# Patient Record
Sex: Male | Born: 1980 | Hispanic: Yes | Marital: Single | Smoking: Current every day smoker
Health system: Southern US, Community
[De-identification: ages and names within clinical notes are randomized; demographics above are authoritative.]

## PROBLEM LIST (undated history)

## (undated) DIAGNOSIS — E119 Type 2 diabetes mellitus without complications: Secondary | ICD-10-CM

---

## 2001-08-26 ENCOUNTER — Ambulatory Visit (HOSPITAL_COMMUNITY): Admission: RE | Admit: 2001-08-26 | Discharge: 2001-08-26 | Payer: Self-pay | Admitting: Internal Medicine

## 2001-08-26 ENCOUNTER — Encounter: Payer: Self-pay | Admitting: Internal Medicine

## 2001-10-02 ENCOUNTER — Emergency Department (HOSPITAL_COMMUNITY): Admission: EM | Admit: 2001-10-02 | Discharge: 2001-10-02 | Payer: Self-pay | Admitting: Emergency Medicine

## 2001-10-07 ENCOUNTER — Encounter: Payer: Self-pay | Admitting: General Surgery

## 2001-10-07 ENCOUNTER — Observation Stay (HOSPITAL_COMMUNITY): Admission: RE | Admit: 2001-10-07 | Discharge: 2001-10-08 | Payer: Self-pay | Admitting: General Surgery

## 2002-10-22 ENCOUNTER — Emergency Department (HOSPITAL_COMMUNITY): Admission: EM | Admit: 2002-10-22 | Discharge: 2002-10-23 | Payer: Self-pay | Admitting: Emergency Medicine

## 2014-06-09 ENCOUNTER — Encounter (HOSPITAL_BASED_OUTPATIENT_CLINIC_OR_DEPARTMENT_OTHER): Payer: Self-pay | Admitting: Emergency Medicine

## 2014-06-09 ENCOUNTER — Emergency Department (HOSPITAL_BASED_OUTPATIENT_CLINIC_OR_DEPARTMENT_OTHER)
Admission: EM | Admit: 2014-06-09 | Discharge: 2014-06-09 | Disposition: A | Discharge status: Home / Self Care | Payer: BC Managed Care – PPO | Attending: Emergency Medicine | Admitting: Emergency Medicine

## 2014-06-09 ENCOUNTER — Emergency Department (HOSPITAL_BASED_OUTPATIENT_CLINIC_OR_DEPARTMENT_OTHER): Payer: BC Managed Care – PPO

## 2014-06-09 DIAGNOSIS — F17200 Nicotine dependence, unspecified, uncomplicated: Secondary | ICD-10-CM | POA: Insufficient documentation

## 2014-06-09 DIAGNOSIS — Z79899 Other long term (current) drug therapy: Secondary | ICD-10-CM | POA: Insufficient documentation

## 2014-06-09 DIAGNOSIS — M47817 Spondylosis without myelopathy or radiculopathy, lumbosacral region: Secondary | ICD-10-CM | POA: Insufficient documentation

## 2014-06-09 DIAGNOSIS — M549 Dorsalgia, unspecified: Secondary | ICD-10-CM | POA: Insufficient documentation

## 2014-06-09 DIAGNOSIS — M47816 Spondylosis without myelopathy or radiculopathy, lumbar region: Secondary | ICD-10-CM

## 2014-06-09 MED ORDER — CYCLOBENZAPRINE HCL 5 MG PO TABS: 5.0000 mg | ORAL_TABLET | Freq: Three times a day (TID) | ORAL | Status: DC | PRN

## 2014-06-09 MED ORDER — METHYLPREDNISOLONE (PAK) 4 MG PO TABS: ORAL_TABLET | ORAL | Status: DC

## 2014-06-09 NOTE — Discharge Instructions (Signed)
You were treated for low back pain. 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 Also there is some increased straightening of your spine, which may be due to muscle spasm and causing some pain. Prescribed Steroid Dose Pack - follow instructions and complete this medicine to reduce inflammation. Prescribed Flexeril (muscle relaxant) - take 1 to 2 tablets up to 3 times a day as needed for pain. Caution this may make you sleepy, do not take if driving or while working with heavy machinery.  If you develop significant worsening symptoms with back pain, fever/chills, difficulty urinating or unable to control urination / bowel movements, worsening numbness, tingling or shooting pain down your legs, please call your doctor and seek immediate medical attention, please return to Emergency Department for further evaluation.  Resources provided to help you locate a regular doctor. Recommend follow-up with a local Sports Medicine Doctor Vincenza Hews [place]) - call [phone] to schedule an appointment.   Degenerative Disk Disease Degenerative disk disease is a condition caused by the changes that occur in the cushions of the backbone (spinal disks) as you grow older. Spinal disks are soft and compressible disks located between the bones of the spine (vertebrae). They act like shock absorbers. Degenerative disk disease can affect the whole spine. However, the neck and lower back are most commonly affected. Many changes can occur in the spinal disks with aging, such as:  The spinal disks may dry and shrink.  Small tears may occur in the tough, outer covering of the disk (annulus).  The disk space may become smaller due to loss of water.  Abnormal growths in the bone (spurs) may occur. This can put pressure on the nerve roots exiting the spinal canal, causing pain.  The spinal canal may become narrowed. CAUSES  Degenerative disk disease is a condition caused by the changes  that occur in the spinal disks with aging. The exact cause is not known, but there is a genetic basis for many patients. Degenerative changes can occur due to loss of fluid in the disk. This makes the disk thinner and reduces the space between the backbones. Small cracks can develop in the outer layer of the disk. This can lead to the breakdown of the disk. You are more likely to get degenerative disk disease if you are overweight. Smoking cigarettes and doing heavy work such as weightlifting can also increase your risk of this condition. Degenerative changes can start after a sudden injury. Growth of bone spurs can compress the nerve roots and cause pain.  SYMPTOMS  The symptoms vary from person to person. Some people may have no pain, while others have severe pain. The pain may be so severe that it can limit your activities. The location of the pain depends on the part of your backbone that is affected. You will have neck or arm pain if a disk in the neck area is affected. You will have pain in your back, buttocks, or legs if a disk in the lower back is affected. The pain becomes worse while bending, reaching up, or with twisting movements. The pain may start gradually and then get worse as time passes. It may also start after a major or minor injury. You may feel numbness or tingling in the arms or legs.  DIAGNOSIS  Your caregiver will ask you about your symptoms and about activities or habits that may cause the pain. He or she may also ask about any injuries, diseases, or treatments you  have had earlier. Your caregiver will examine you to check for the range of movement that is possible in the affected area, to check for strength in your extremities, and to check for sensation in the areas of the arms and legs supplied by different nerve roots. An X-ray of the spine may be taken. Your caregiver may suggest other imaging tests, such as magnetic resonance imaging (MRI), if needed.  TREATMENT  Treatment  includes rest, modifying your activities, and applying ice and heat. Your caregiver may prescribe medicines to reduce your pain and may ask you to do some exercises to strengthen your back. In some cases, you may need surgery. You and your caregiver will decide on the treatment that is best for you. HOME CARE INSTRUCTIONS   Follow proper lifting and walking techniques as advised by your caregiver.  Maintain good posture.  Exercise regularly as advised.  Perform relaxation exercises.  Change your sitting, standing, and sleeping habits as advised. Change positions frequently.  Lose weight as advised.  Stop smoking if you smoke.  Wear supportive footwear. SEEK MEDICAL CARE IF:  Your pain does not go away within 1 to 4 weeks. SEEK IMMEDIATE MEDICAL CARE IF:   Your pain is severe.  You notice weakness in your arms, hands, or legs.  You begin to lose control of your bladder or bowel movements. MAKE SURE YOU:   Understand these instructions.  Will watch your condition.  Will get help right away if you are not doing well or get worse. Document Released: 08/18/2007 Document Revised: 01/13/2012 Document Reviewed: 02/22/2014 [facility] Patient Information 2015 [place], [place]. This information is not intended to replace advice given to you by your health care provider. Make sure you discuss any questions you have with your health care provider.  Back Exercises These exercises may help you when beginning to rehabilitate your injury. Your symptoms may resolve with or without further involvement from your physician, physical therapist or athletic trainer. While completing these exercises, remember:   Restoring tissue flexibility helps normal motion to return to the joints. This allows healthier, less painful movement and activity.  An effective stretch should be held for at least 30 seconds.  A stretch should never be painful. You should only feel a gentle lengthening or release in the  stretched tissue. STRETCH - Extension, Prone on Elbows   Lie on your stomach on the floor, a bed will be too soft. Place your palms about shoulder width apart and at the height of your head.  Place your elbows under your shoulders. If this is too painful, stack pillows under your chest.  Allow your body to relax so that your hips drop lower and make contact more completely with the floor.  Hold this position for __________ seconds.  Slowly return to lying flat on the floor. Repeat __________ times. Complete this exercise __________ times per day.  RANGE OF MOTION - Extension, Prone Press Ups   Lie on your stomach on the floor, a bed will be too soft. Place your palms about shoulder width apart and at the height of your head.  Keeping your back as relaxed as possible, slowly straighten your elbows while keeping your hips on the floor. You may adjust the placement of your hands to maximize your comfort. As you gain motion, your hands will come more underneath your shoulders.  Hold this position __________ seconds.  Slowly return to lying flat on the floor. Repeat __________ times. Complete this exercise __________ times per day.  RANGE OF MOTION- Quadruped, Neutral Spine   Assume a hands and knees position on a firm surface. Keep your hands under your shoulders and your knees under your hips. You may place padding under your knees for comfort.  Drop your head and point your tail bone toward the ground below you. This will round out your low back like an angry cat. Hold this position for __________ seconds.  Slowly lift your head and release your tail bone so that your back sags into a large arch, like an old horse.  Hold this position for __________ seconds.  Repeat this until you feel limber in your low back.  Now, find your "sweet spot." This will be the most comfortable position somewhere between the two previous positions. This is your neutral spine. Once you have found this  position, tense your stomach muscles to support your low back.  Hold this position for __________ seconds. Repeat __________ times. Complete this exercise __________ times per day.  STRETCH - Flexion, Single Knee to Chest   Lie on a firm bed or floor with both legs extended in front of you.  Keeping one leg in contact with the floor, bring your opposite knee to your chest. Hold your leg in place by either grabbing behind your thigh or at your knee.  Pull until you feel a gentle stretch in your low back. Hold __________ seconds.  Slowly release your grasp and repeat the exercise with the opposite side. Repeat __________ times. Complete this exercise __________ times per day.  STRETCH - Hamstrings, Standing  Stand or sit and extend your right / left leg, placing your foot on a chair or foot stool  Keeping a slight arch in your low back and your hips straight forward.  Lead with your chest and lean forward at the waist until you feel a gentle stretch in the back of your right / left knee or thigh. (When done correctly, this exercise requires leaning only a small distance.)  Hold this position for __________ seconds. Repeat __________ times. Complete this stretch __________ times per day. STRENGTHENING - Deep Abdominals, Pelvic Tilt   Lie on a firm bed or floor. Keeping your legs in front of you, bend your knees so they are both pointed toward the ceiling and your feet are flat on the floor.  Tense your lower abdominal muscles to press your low back into the floor. This motion will rotate your pelvis so that your tail bone is scooping upwards rather than pointing at your feet or into the floor.  With a gentle tension and even breathing, hold this position for __________ seconds. Repeat __________ times. Complete this exercise __________ times per day.  STRENGTHENING - Abdominals, Crunches   Lie on a firm bed or floor. Keeping your legs in front of you, bend your knees so they are both  pointed toward the ceiling and your feet are flat on the floor. Cross your arms over your chest.  Slightly tip your chin down without bending your neck.  Tense your abdominals and slowly lift your trunk high enough to just clear your shoulder blades. Lifting higher can put excessive stress on the low back and does not further strengthen your abdominal muscles.  Control your return to the starting position. Repeat __________ times. Complete this exercise __________ times per day.  STRENGTHENING - Quadruped, Opposite UE/LE Lift   Assume a hands and knees position on a firm surface. Keep your hands under your shoulders and your knees under your  hips. You may place padding under your knees for comfort.  Find your neutral spine and gently tense your abdominal muscles so that you can maintain this position. Your shoulders and hips should form a rectangle that is parallel with the floor and is not twisted.  Keeping your trunk steady, lift your right hand no higher than your shoulder and then your left leg no higher than your hip. Make sure you are not holding your breath. Hold this position __________ seconds.  Continuing to keep your abdominal muscles tense and your back steady, slowly return to your starting position. Repeat with the opposite arm and leg. Repeat __________ times. Complete this exercise __________ times per day. Document Released: 11/08/2005 Document Revised: 01/13/2012 Document Reviewed: 02/02/2009 [facility] Patient Information 2015 [place], [place]. This information is not intended to replace advice given to you by your health care provider. Make sure you discuss any questions you have with your health care provider.   Emergency Department Resource Guide 1) Find a Doctor and Pay Out of Pocket Although you won't have to find out who is covered by your insurance plan, it is a good idea to ask around and get recommendations. You will then need to call the office and see if the doctor  you have chosen will accept you as a new patient and what types of options they offer for patients who are self-pay. Some doctors offer discounts or will set up payment plans for their patients who do not have insurance, but you will need to ask so you aren't surprised when you get to your appointment.  2) Contact Your Local Health Department Not all health departments have doctors that can see patients for sick visits, but many do, so it is worth a call to see if yours does. If you don't know where your local health department is, you can check in your phone book. The CDC also has a tool to help you locate your state's health department, and many state websites also have listings of all of their local health departments.  3) Find a Walk-in Clinic If your illness is not likely to be very severe or complicated, you may want to try a walk in clinic. These are popping up all over the country in pharmacies, drugstores, and shopping centers. They're usually staffed by nurse practitioners or physician assistants that have been trained to treat common illnesses and complaints. They're usually fairly quick and inexpensive. However, if you have serious medical issues or chronic medical problems, these are probably not your best option.  No Primary Care Doctor: - Call Health Connect at  [phone] - they can help you locate a primary care doctor that  accepts your insurance, provides certain services, etc. - Physician Referral Service- [phone]  Chronic Pain Problems: Organization         Address  Phone   Notes  [facility]  [phone] Patients need to be referred by their primary care doctor.   Medication Assistance: Organization         Address  Phone   Notes  [facility] [address] [place], [place] [address] [phone] --Must be a resident of [facility] -- Must have NO insurance coverage whatsoever (no Medicaid/  Medicare, etc.) -- The pt. MUST have a primary care doctor that directs their care regularly and follows them in the community   [facility]  [phone]   [address]  [phone]  Agencies that provide inexpensive medical care: Organization         Address  Phone   Notes  [facility]  [phone]   [facility]    [phone]   [facility] [address] [place], [place] [address] [phone]   [facility] [address] [place] [place] [phone]   Planned Parenthood    [phone]   [facility]    [phone]   [facility]  [address], [place] Phone:  [phone], Fax:  [phone] Hours of Operation:  9 am - 6 pm, M-F.  Also accepts Medicaid/Medicare and self-pay.  [facility]  [address], [place] Phone: [phone], Fax: [phone]. Hours of Operation:  8:30 am - 5:30 pm, M-F.  Also accepts Medicaid and self-pay.  [facility] [place] [address] [place] Phone: [phone]   [facility] [address] Natasha Bence [place], [place] [phone], Ext. [phone] Mondays & Thursdays: 7-9 AM.  First 15 patients are seen on a first come, first serve basis.    Medicaid-accepting [facility] Providers:  Organization         Address  Phone   Notes  [facility] [address], [place] [phone] Also accepts self-pay patients.  [facility] [address] Laurell Josephs [place], [place]  [phone]   [facility] [address], [place] [phone]   [facility] [address], [place] [phone]   Renaye Rakers [address], [place]   [phone] Only accepts [place] patients after they have their name applied to their card.    Self-Pay (no insurance) in [facility]:  Organization         Address  Phone   Notes  Sickle Cell Patients, [facility] [address] [place], [place] [phone]   [facility] [address] [place], [place] [phone]   [facility] [place]  [address],  [phone]   [facility]/Dr. Osei-Bonsu  [address] [place] or [address], [place] [phone] Phone number for both [place] and [place] locations is the same.  [facility] [address], [place] [phone]   [facility] [address] [place] or [address] [phone]   [facility] [address], [place] [phone], phone; [phone], fax Sees patients 1st and 3rd Saturday of every month.  Must not qualify for public or private insurance (i.e. Medicaid, Medicare, [place] Health Choice, Veterans' Benefits)  Household income should be no more than 200% of the poverty level The clinic cannot treat you if you are pregnant or think you are pregnant  Sexually transmitted diseases are not treated at the clinic.    Dental Care: Organization         Address  Phone  Notes  [facility] [address] [place], [place] [phone] Accepts children up to age 21 who are enrolled in [place] or [place] Health Choice; pregnant women with a Medicaid card; and children who have applied for Medicaid or   Health Choice, but were declined, whose parents can pay a reduced fee at time of service.  [facility] of [facility]  [address], [place] [phone] Accepts children up to age 38 who are enrolled in [place] or [place] Health Choice; pregnant women with a Medicaid card; and children who have applied for Medicaid or [place] Health Choice,  but were declined, whose parents can pay a reduced fee at time of service.  [facility]  [address] [place], [place] [phone] Patients are seen by appointment only. Walk-ins are not accepted. [facility] will see patients 46 years of age and older. Monday - Tuesday (8am-5pm) Most Wednesdays (8:30-5pm) $30 per visit, cash only  [facility]  [address], [facility] [phone] Patients are seen by appointment only. Walk-ins are not accepted. [facility] will see patients 32 years of age and older. One Wednesday Evening (Monthly: Volunteer Based).  $30 per visit, cash only  [employer]  [phone] for adults; Children under age 43, call Graduate Pediatric Dentistry at [phone]. Children aged 81-14, please call [phone] to request a pediatric application.  Dental services are provided in all areas of dental care including fillings, crowns and bridges, complete and partial dentures, implants, gum treatment, root canals, and extractions. Preventive care is also provided. Treatment is provided to both adults and children. Patients are selected via a lottery and there is often a waiting list.   [facility] [address], [place]  [phone] [email or web address]   [facility] [address] [place], [place] [phone], Ext. [phone] Second and Fourth Thursday of each month, opens at 6:30 AM; Clinic ends at 9 AM.  Patients are seen on a first-come first-served basis, and a limited number are seen during each clinic.   [facility]  [address] Ether Griffins [place], [place] [phone]   Eligibility Requirements You must have lived in [place], [place], or [place] counties for at least the last three months.   You cannot be eligible for state or federal sponsored [place], including [employer], [place], or [employer].   You generally  cannot be eligible for healthcare insurance through your employer.    How to apply: Eligibility screenings are held every Tuesday and Wednesday afternoon from 1:00 pm until 4:00 pm. You do not need an appointment for the interview!  [facility] [address], [place], [place] [phone]   [facility] Health Department  [phone]   [facility]  [phone]   [facility]  [phone]    Behavioral Health Resources in the Community: Intensive Outpatient Programs Organization         Address  Phone  Notes  [facility] [address], [place], [place] [phone]   [facility] Outpatient [address] [place], [place] [phone]   [facility] [address], [place], [place]  [phone]   [facility] [address],  [place], [place] [phone] or [phone]   Substance Abuse Resources Organization         Address  Phone  Notes  [facility]  [phone]   [facility]  [phone]   [facility]  [phone]   Floydene Flock  [phone]   Residential & Outpatient Substance Abuse Program  [phone]  Psychological Services Organization         Address  Phone  Notes  [facility]  [phone]- [phone]   [facility]  [phone]- [phone]   [facility] [phone], [place] [phone] or [phone]    Mobile Crisis Teams Organization         Address  Phone  Notes  [facility], Mobile Crisis Care Unit  [phone]   [facility]  [address] [place], [place] [phone]   Doristine LocksSharon DeEsch [address] [place] [phone]    Self-Help/Support Groups Organization         Address  Phone             Notes  [facility] [place] - variety of support groups  [phone] Call for more  information  Narcotics Anonymous (NA), [facility] [address], [place]  2 meetings at this location   Statisticianesidential Treatment Programs Organization         Address  Phone  Notes  [facility] [address],    [place]  [phone]   [facility]  [address], [place] [phone], [place], [place] [phone]   [facility] [address], [place] [phone] Admissions: 8am-3pm M-F  [facility] [address]    [place], [place] [phone]   [facility] [address], [place], [place] [phone]   [facility] [address]  [place], [place] [phone]   [facility] - Intensive Outpatient [address], [place], [place] [phone]   [facility] ([facility] [address]  [place], [place] [phone] or [phone]   [facility] ([facility]) [address] [place], [place] [phone] Accepts Medicaid  [facility] [address]  [place] [place] [phone] Substance Abuse/Addiction Treatment   [facility]         Address  Phone  Notes  [facility]  [phone]   Angie FavaJulie Brannon, PhD [address], [place], [place]   [phone] or [phone]) [phone]   [facility]   [address] [place], [place] [phone]) [phone]   [facility] [address], [place], [place] [phone] Insurance/Medicaid/sponsorship through [facility]  [facility] [address]                                    [place], [place] [phone]) [phone] Therapy/tele-psych/case  [facility] [address]   [place], [place] [phone]) [phone]    Dr. Lolly MustacheArfeen  [phone]) [phone]   [facility]  [facility] 1) [address], [place] 2) [address], [place] 3)  [address], [place] [phone])  [phone]) [phone]  [phone]) [phone]   [facility] Hotline [phone](336) [phone] or [phone]) [phone] (After Hours)

## 2014-06-09 NOTE — ED Notes (Signed)
C/o low back pain and pain is going down legs, knees feel numb. No injury. Pain is worse with movement. No urination complaints.

## 2014-06-09 NOTE — ED Provider Notes (Signed)
CSN: [id number]     Arrival date & time 06/09/14  [id number] History   First MD Initiated Contact with Patient 06/09/14 [phone]   History provided by patient.  Chief Complaint  Patient presents with  . Back Pain   HPI  Patient reported new onset Left lower back pain that started 3 days ago after waking up and walking around, seemed to be gradual onset with worsening. Denies any acute accident, injury, trauma, or fall. Pain described as aching, with radiation bilateral anterior hips and down both legs to knees, associated with some numbness and tingling in legs near knees. Denies sharp shooting pains down legs. Worse with ambulation, back extension, and improved with forward flexion and rest. Tried Tylenol without relief, no other medicines. Denies any history of fevers/chills, recent illness, urinary/stool retention or incontinence. Admits to some heavy lifting at work.  Reported significant prior event about 5-6 years ago while at work with a fork lift accident, where fork lift was driven into his lower back, caused primarily low back pain, he stated that he went to the doctor at that time, did not have any X-rays or surgery, gradually improved.  History reviewed. No pertinent past medical history. History reviewed. No pertinent past surgical history. No family history on file. History  Substance Use Topics  . Smoking status: Current Every Day Smoker -- 0.50 packs/day  . Smokeless tobacco: Not on file  . Alcohol Use: Not on file    Review of Systems  See above HPI.  Allergies  Review of patient's allergies indicates no known allergies.  Home Medications   Prior to Admission medications   Medication Sig Start Date End Date Taking? Authorizing Provider  cyclobenzaprine (FLEXERIL) 5 MG tablet Take 1-2 tablets (5-10 mg total) by mouth 3 (three) times daily as needed for muscle spasms. 06/09/14   Saralyn PilarAlexander Karamalegos, DO  methylPREDNIsolone (MEDROL DOSPACK) 4 MG tablet follow package directions  06/09/14   Saralyn PilarAlexander Karamalegos, DO   BP 146/95  Pulse 83  Temp(Src) 98.4 F (36.9 C) (Oral)  Resp 18  SpO2 99% Physical Exam  Gen - well-appearing, cooperative, NAD Neck - supple, non-tender  Heart - mild tachycardic, regular rhythm, no murmurs heard  Lungs - CTAB. Normal work of breathing.  Abd - soft, NTND, no masses, +active BS  MSK - Lower back - left paraspinal L5 - SI musculature tender to palpation with mild hypertonicity, non-tender over spinous processes, normal active ROM L-spine, supine SLR bilaterally with reproduction of pain but w/o radicular symptoms.  Ext - non-tender, no edema, peripheral pulses intact +2 b/l  Skin - warm, dry, no rashes  Neuro - awake, alert, oriented, grossly non-focal, intact muscle strength 5/5 b/l, intact distal sensation to light touch, gait normal  ED Course  Procedures (including critical care time) Labs Review Labs Reviewed - No data to display  Imaging Review Dg Lumbar Spine Complete  06/09/2014   CLINICAL DATA:  Low back pain.  No injury.  EXAM: LUMBAR SPINE - COMPLETE 4+ VIEW  COMPARISON:  None.  FINDINGS: No fracture. No spondylolisthesis. Mild straightening of the normal lumbar lordosis. Mild loss of disc height at L5-S1. Remaining disc spaces are relatively well preserved. Facet joints are well preserved.  Soft tissues are unremarkable.  IMPRESSION: No fracture or acute finding. Mild disc degenerative change at L5-S1 and mild straightening of the normal lumbar lordosis.   Electronically Signed   By: Amie Portlandavid  Ormond M.D.   On: 06/09/2014 09:23     EKG Interpretation  None      MDM   Final diagnoses:  Lumbar spondylosis, unspecified spinal osteoarthritis   33 yr M without significant PMH presents for acute gradual onset Left LBP with associated radiation down b/l legs to knees with some tingling / sharp pains. Denies recent trauma, accident, or injury. Reported significant hx injury 5-6 yrs ago low back injury after hit by a fork  lift, resolved s/p conservative therapy. Suspect current injury is exacerbation of prior injury, with possible muscle strain at work. No red flag symptoms.  Proceed with L-spine X-ray for further evaluation in setting of prior traumatic injury.  UPDATE @ 0908 - L-spine X-ray results with mild DJD L5-S1 and mild straightening of lumbar lordosis, which may suggest muscle spasm contributing to pain. Concern for some intermittent radicular symptoms.  Discharged to home with [facility] taper, Flexeril 5mg  TID PRN, advised to establish with PCP for routine follow-up, provided hand out with resources, referral information to Norton Blizzard, MD (Sports Med, local), note out of work with return date, relative rest and stretching, Tylenol PRN. Return precautions given.     Saralyn Pilar, DO 06/09/14 1430

## 2014-06-10 NOTE — ED Provider Notes (Signed)
I saw and evaluated the patient, reviewed the resident's note and I agree with the findings and plan.   EKG Interpretation None      Pt with lower back pain, no neuro deficits or signs of cauda equina, no abd pain, likely musculoskeletal  Rolan BuccoMelanie Basim Bartnik, MD 06/10/14 [phone]

## 2014-06-14 HISTORY — PX: ELBOW SURGERY: SHX618

## 2014-06-16 ENCOUNTER — Other Ambulatory Visit: Payer: BC Managed Care – PPO | Admitting: Family Medicine

## 2014-06-20 ENCOUNTER — Ambulatory Visit (INDEPENDENT_AMBULATORY_CARE_PROVIDER_SITE_OTHER): Payer: Self-pay | Admitting: Family Medicine

## 2014-06-20 ENCOUNTER — Encounter: Payer: Self-pay | Admitting: Family Medicine

## 2014-06-20 VITALS — BP 130/84 | HR 101 | Ht 67.0 in | Wt 260.0 lb

## 2014-06-20 DIAGNOSIS — M5430 Sciatica, unspecified side: Secondary | ICD-10-CM

## 2014-06-20 DIAGNOSIS — M545 Low back pain, unspecified: Secondary | ICD-10-CM

## 2014-06-20 DIAGNOSIS — M5442 Lumbago with sciatica, left side: Secondary | ICD-10-CM

## 2014-06-20 MED ORDER — MELOXICAM 15 MG PO TABS: 15.0000 mg | ORAL_TABLET | Freq: Every day | ORAL | Status: DC

## 2014-06-20 MED ORDER — CYCLOBENZAPRINE HCL 10 MG PO TABS: 10.0000 mg | ORAL_TABLET | Freq: Three times a day (TID) | ORAL | Status: DC | PRN

## 2014-06-20 NOTE — Patient Instructions (Signed)
You have lumbar radiculopathy (a pinched nerve in your low back). Take meloxicam 15 mg daily with food for pain and inflammation. Take pain medication from your surgery as needed for severe pain. Flexeril as needed for muscle spasms (no driving on this medicine if it makes you sleepy). Stay as active as possible. Physical therapy has been shown to be helpful as well - add this on to the therapy for your elbow. Strengthening of low back muscles, abdominal musculature are key for long term pain relief. If not improving, will consider further imaging (MRI). Follow up with me in 4 weeks. Call sooner if things are getting worse.

## 2014-06-22 ENCOUNTER — Encounter: Payer: Self-pay | Admitting: Family Medicine

## 2014-06-22 DIAGNOSIS — M5442 Lumbago with sciatica, left side: Secondary | ICD-10-CM | POA: Insufficient documentation

## 2014-06-22 NOTE — Assessment & Plan Note (Signed)
history consistent with radiculopathy from disc bulge in lumbar spine.  Start physical therapy, meloxicam with pain medication and flexeril as needed.  Consider MRI if not improving over the next 4 weeks.  He is s/p medrol dose pack.

## 2014-06-22 NOTE — Progress Notes (Signed)
Patient ID: Rodney Fisher, male   DOB: 1981/04/21, 33 y.o.   MRN: [id number]  PCP: No primary provider on file.  Subjective:   HPI: Patient is a 33 y.o. male here for low back pain.  Patient denies known injury. Had been exercising more recently. Started getting pain in low back about 2 weeks ago. Associated with radiation down left leg. Feels like a needle pain. Was in both legs, now only on the left. No bowel/bladder dysfunction. Tried medrol and flexeril - helped some. No prior issues with back.  History reviewed. No pertinent past medical history.  Current Outpatient Prescriptions on File Prior to Visit  Medication Sig Dispense Refill  . methylPREDNIsolone (MEDROL DOSPACK) 4 MG tablet follow package directions  21 tablet  0   No current facility-administered medications on file prior to visit.    Past Surgical History  Procedure Laterality Date  . Elbow surgery  06/14/2014    No Known Allergies  History   Social History  . Marital Status: Single    Spouse Name: N/A    Number of Children: N/A  . Years of Education: N/A   Occupational History  . Not on file.   Social History Main Topics  . Smoking status: Current Every Day Smoker -- 0.50 packs/day  . Smokeless tobacco: Not on file  . Alcohol Use: Not on file  . Drug Use: Not on file  . Sexual Activity: Not on file   Other Topics Concern  . Not on file   Social History Narrative  . No narrative on file    No family history on file.  BP 130/84  Pulse 101  Ht 5\' 7"  (1.702 m)  Wt 260 lb (117.935 kg)  BMI 40.71 kg/m2  Review of Systems: See HPI above.    Objective:  Physical Exam:  Gen: NAD  Back: No gross deformity, scoliosis. TTP mildly left lumbar paraspinal region.  No midline or bony TTP. FROM. Strength LEs 5/5 all muscle groups.   2+ MSRs in patellar and achilles tendons, equal bilaterally. Negative SLRs. Sensation intact to light touch bilaterally. Negative logroll bilateral  hips Negative fabers and piriformis stretches.    Assessment & Plan:  1. Low back pain - history consistent with radiculopathy from disc bulge in lumbar spine.  Start physical therapy, meloxicam with pain medication and flexeril as needed.  Consider MRI if not improving over the next 4 weeks.  He is s/p medrol dose pack.

## 2014-07-18 ENCOUNTER — Encounter: Payer: Self-pay | Admitting: Family Medicine

## 2014-07-18 ENCOUNTER — Ambulatory Visit (INDEPENDENT_AMBULATORY_CARE_PROVIDER_SITE_OTHER): Payer: Self-pay | Admitting: Family Medicine

## 2014-07-18 VITALS — BP 148/86 | HR 92 | Ht 67.0 in | Wt 260.0 lb

## 2014-07-18 DIAGNOSIS — M5430 Sciatica, unspecified side: Secondary | ICD-10-CM

## 2014-07-18 DIAGNOSIS — M5442 Lumbago with sciatica, left side: Secondary | ICD-10-CM

## 2014-07-20 ENCOUNTER — Encounter: Payer: Self-pay | Admitting: Family Medicine

## 2014-07-20 NOTE — Assessment & Plan Note (Signed)
history consistent with radiculopathy from disc bulge in lumbar spine.  Much improved following medrol, meloxicam, flexeril, pain medication.  Consider physical therapy if pain worsens.  Otherwise f/u prn.

## 2014-07-20 NOTE — Progress Notes (Signed)
Patient ID: Rodney Fisher, male   DOB: [birth year], 33 y.o.   MRN: [id number]  PCP: No primary provider on file.  Subjective:   HPI: Patient is a 33 y.o. male here for low back pain.  8/17: Patient denies known injury. Had been exercising more recently. Started getting pain in low back about 2 weeks ago. Associated with radiation down left leg. Feels like a needle pain. Was in both legs, now only on the left. No bowel/bladder dysfunction. Tried medrol and flexeril - helped some. No prior issues with back.  9/14: Patient reports he is over 80% better. Does get some pain into both legs at times. Did not do physical therapy. Has been taking mobic, flexeril, and pain medication. Can bother mainly with a lot of walking. No bowel/bladder dysfunction.  History reviewed. No pertinent past medical history.  Current Outpatient Prescriptions on File Prior to Visit  Medication Sig Dispense Refill  . cyclobenzaprine (FLEXERIL) 10 MG tablet Take 1 tablet (10 mg total) by mouth every 8 (eight) hours as needed.  60 tablet  1  . meloxicam (MOBIC) 15 MG tablet Take 1 tablet (15 mg total) by mouth daily.  30 tablet  2  . methylPREDNIsolone (MEDROL DOSPACK) 4 MG tablet follow package directions  21 tablet  0   No current facility-administered medications on file prior to visit.    Past Surgical History  Procedure Laterality Date  . Elbow surgery  06/14/2014    No Known Allergies  History   Social History  . Marital Status: Single    Spouse Name: N/A    Number of Children: N/A  . Years of Education: N/A   Occupational History  . Not on file.   Social History Main Topics  . Smoking status: Current Every Day Smoker -- 0.50 packs/day  . Smokeless tobacco: Not on file  . Alcohol Use: Not on file  . Drug Use: Not on file  . Sexual Activity: Not on file   Other Topics Concern  . Not on file   Social History Narrative  . No narrative on file    No family history on file.  BP  148/86  Pulse 92  Ht  (1.702 m)  Wt 260 lb (117.935 kg)  BMI 40.71 kg/m2  Review of Systems: See HPI above.    Objective:  Physical Exam:  Gen: NAD  Back: No gross deformity, scoliosis. No TTP paraspinal regions.  No midline or bony TTP. FROM. Strength LEs 5/5 all muscle groups.   2+ MSRs in patellar and achilles tendons, equal bilaterally. Negative SLRs. Sensation intact to light touch bilaterally. Negative logroll bilateral hips Negative fabers and piriformis stretches.    Assessment & Plan:  1. Low back pain - history consistent with radiculopathy from disc bulge in lumbar spine.  Much improved following medrol, meloxicam, flexeril, pain medication.  Consider physical therapy if pain worsens.  Otherwise f/u prn.

## 2016-02-01 IMAGING — CR DG LUMBAR SPINE COMPLETE 4+V
5 series · 5 of 5 positions shown · non-contrast
Comparison: None.

CLINICAL DATA: Low back pain.  No injury.

EXAM:
LUMBAR SPINE - COMPLETE 4+ VIEW

[t l-spine a.p.]
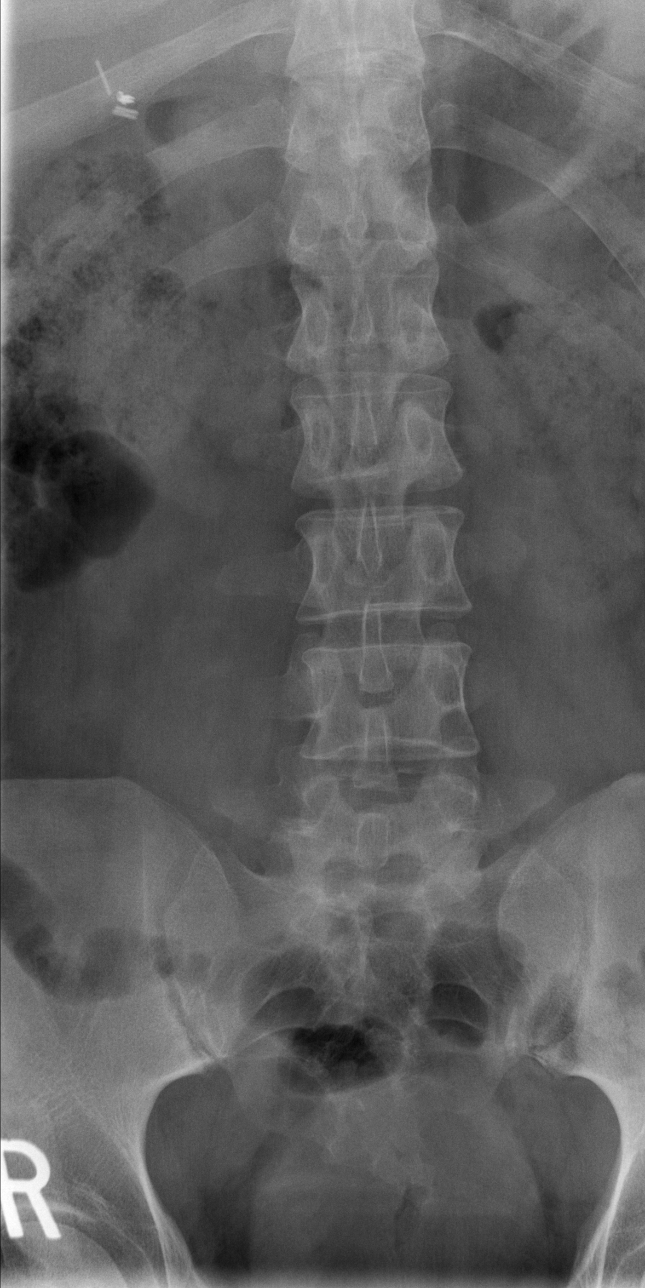

[t l-spine oblique exposure (1 of 2)]
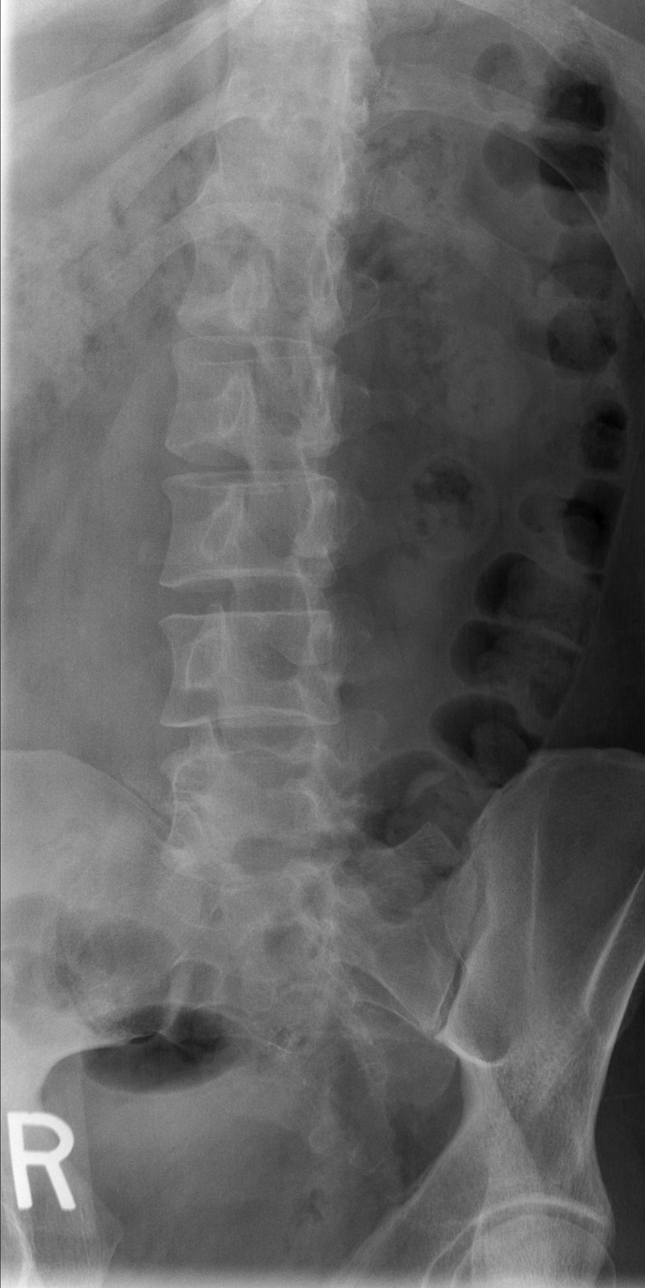

[t l-spine oblique exposure (2 of 2)]
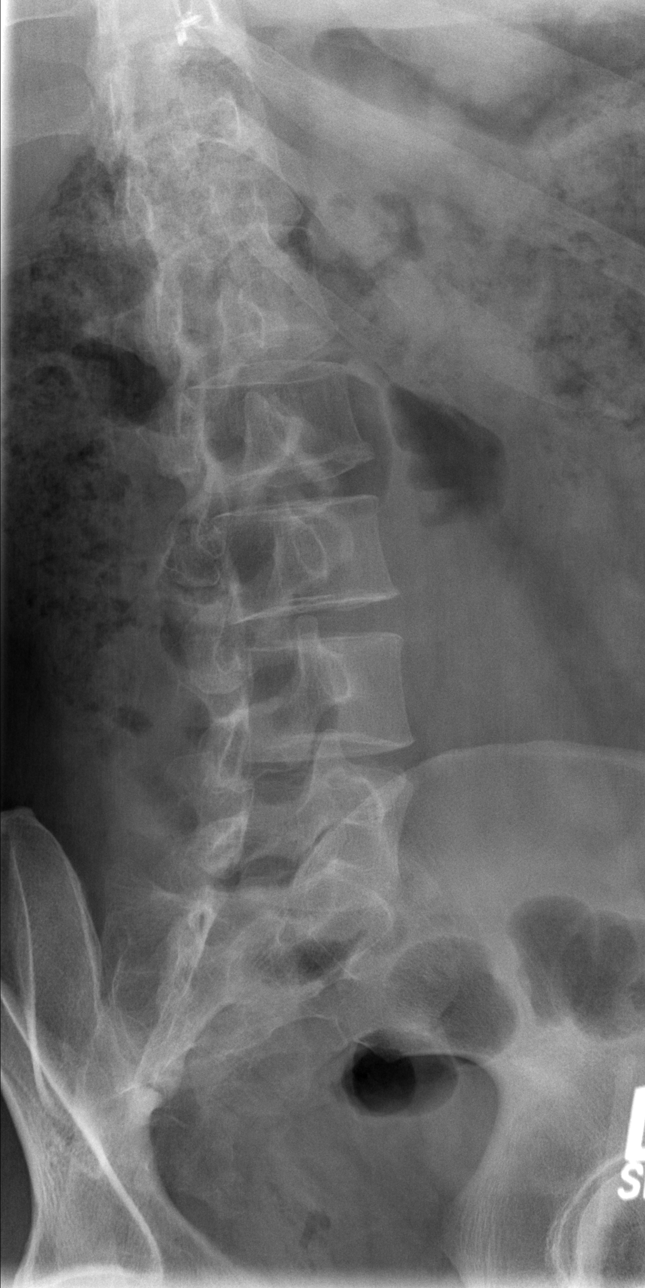

[t l-spine lat]
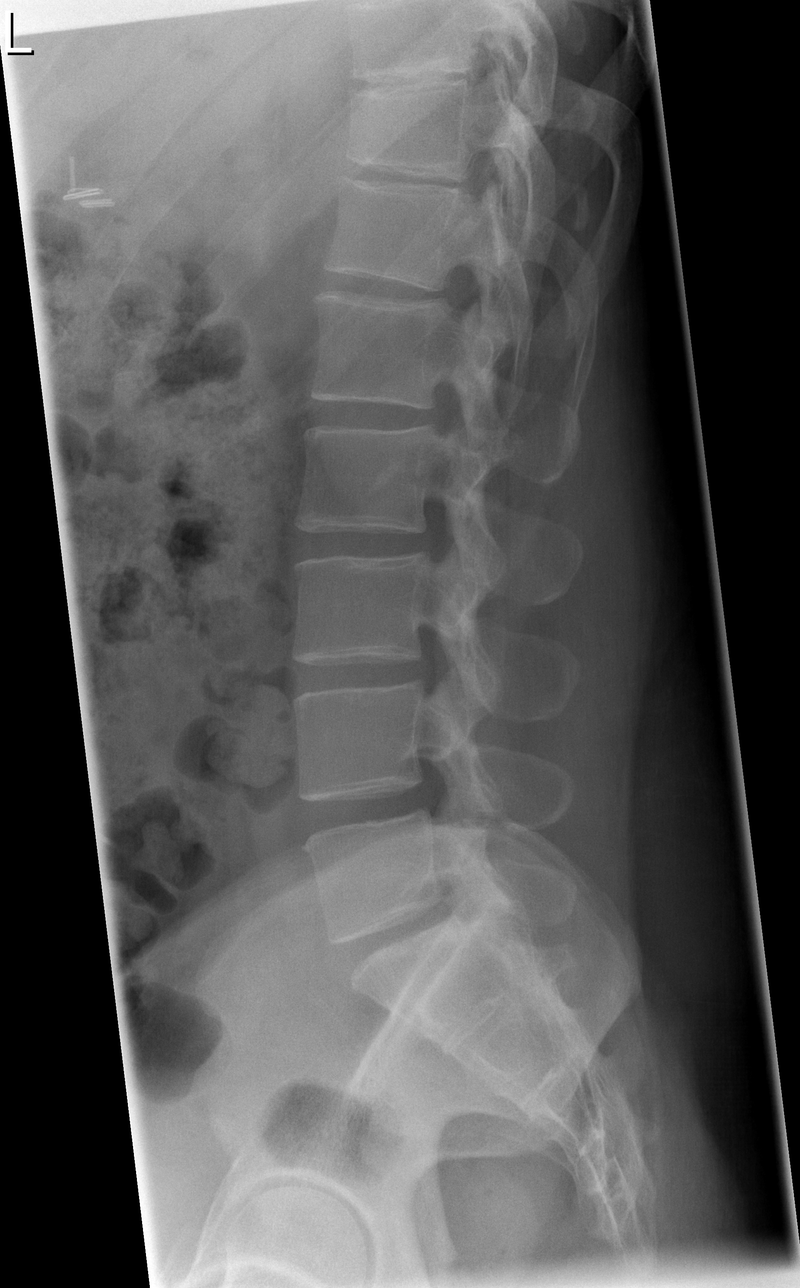

[t l-spine l5-s1 spot]
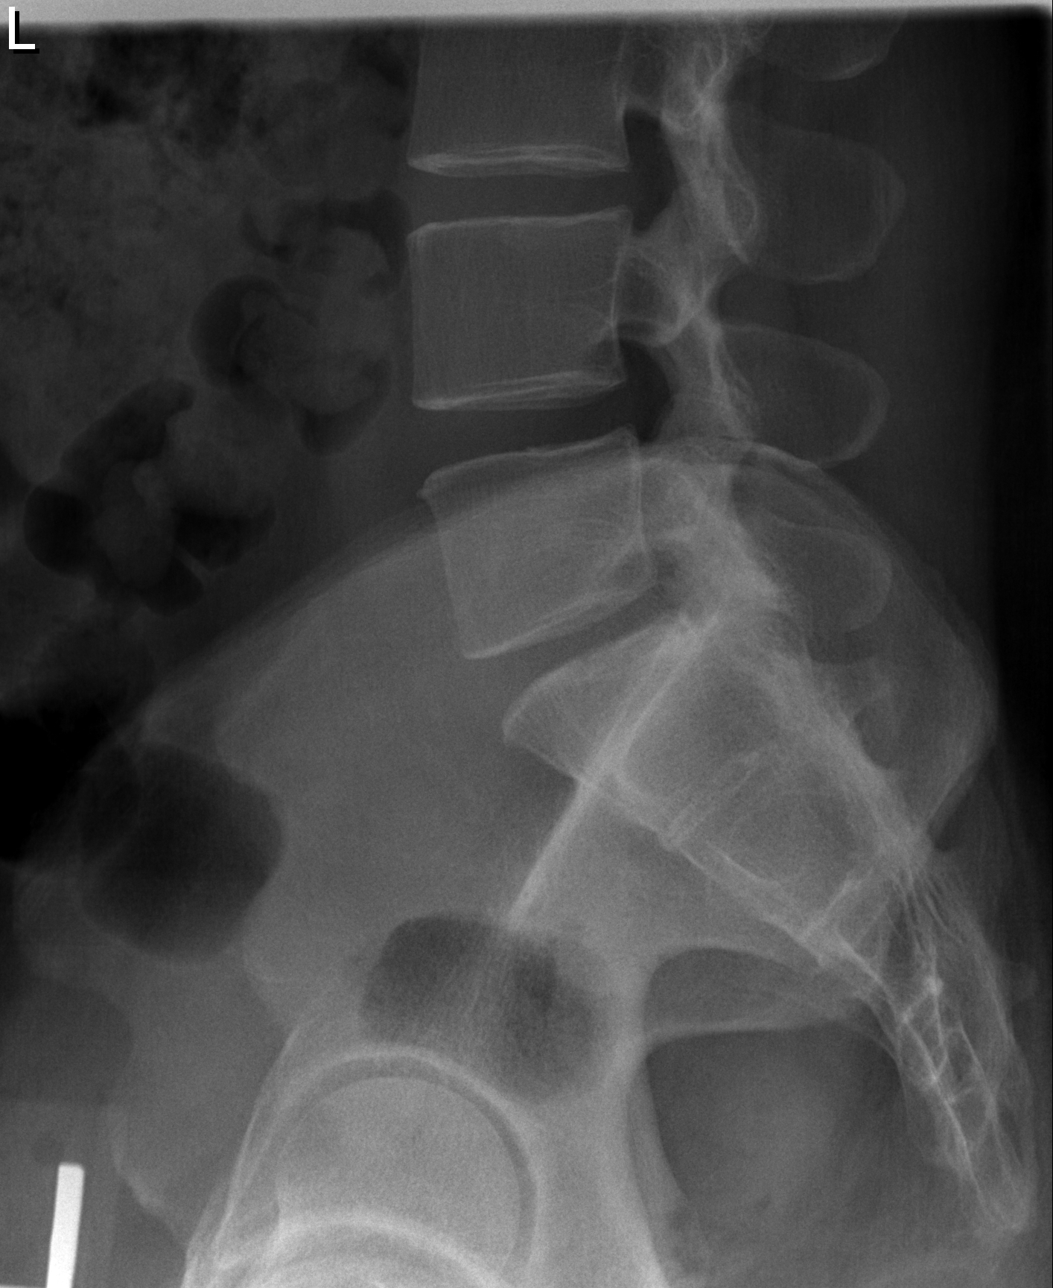

[5 of 5 positions shown; findings below may reference images not displayed]

FINDINGS: No fracture. No spondylolisthesis. Mild straightening of the normal
lumbar lordosis. Mild loss of disc height at L5-S1. Remaining disc
spaces are relatively well preserved. Facet joints are well
preserved.

Soft tissues are unremarkable.
IMPRESSION: No fracture or acute finding. Mild disc degenerative change at L5-S1
and mild straightening of the normal lumbar lordosis.

## 2018-07-25 ENCOUNTER — Encounter (HOSPITAL_BASED_OUTPATIENT_CLINIC_OR_DEPARTMENT_OTHER): Payer: Self-pay | Admitting: Adult Health

## 2018-07-25 ENCOUNTER — Inpatient Hospital Stay (HOSPITAL_BASED_OUTPATIENT_CLINIC_OR_DEPARTMENT_OTHER)
Admission: EM | Admit: 2018-07-25 | Discharge: 2018-07-27 | DRG: 440 | Disposition: A | Discharge status: Home / Self Care | Payer: BLUE CROSS/BLUE SHIELD | Attending: Internal Medicine | Admitting: Internal Medicine

## 2018-07-25 ENCOUNTER — Other Ambulatory Visit: Payer: Self-pay

## 2018-07-25 DIAGNOSIS — R739 Hyperglycemia, unspecified: Secondary | ICD-10-CM

## 2018-07-25 DIAGNOSIS — E1165 Type 2 diabetes mellitus with hyperglycemia: Secondary | ICD-10-CM | POA: Diagnosis present

## 2018-07-25 DIAGNOSIS — F17210 Nicotine dependence, cigarettes, uncomplicated: Secondary | ICD-10-CM | POA: Diagnosis present

## 2018-07-25 DIAGNOSIS — K859 Acute pancreatitis without necrosis or infection, unspecified: Secondary | ICD-10-CM | POA: Diagnosis present

## 2018-07-25 DIAGNOSIS — Z79899 Other long term (current) drug therapy: Secondary | ICD-10-CM

## 2018-07-25 MED ORDER — GI COCKTAIL ~~LOC~~
30.0000 mL | Freq: Once | ORAL | Status: AC
  Administered 2018-07-26: 30 mL via ORAL
  Filled 2018-07-25: qty 30

## 2018-07-25 NOTE — ED Provider Notes (Signed)
[facility] EMERGENCY DEPARTMENT Provider Note  CSN: [id number] Arrival date & time: 07/25/18 2214  Chief Complaint(s) Abdominal Pain  HPI Rodney Fisher is a 37 y.o. male   The history is provided by the patient.  Abdominal Pain   This is a new problem. Episode onset: 6 days. The problem occurs constantly. Progression since onset: fluctuating. The pain is associated with an unknown factor. The pain is located in the epigastric region. The quality of the pain is pressure-like. The pain is moderate. Pertinent negatives include anorexia, fever, diarrhea, hematochezia, melena, nausea, vomiting, constipation and dysuria. Associated symptoms comments: Reflux . Exacerbated by: lying supine. Nothing relieves the symptoms.   Patient endorses social alcohol use once or twice a month, no illicit drug use.  States that he works at [employer].   Past Medical History History reviewed. No pertinent past medical history. Patient Active Problem List   Diagnosis Date Noted  . Low back pain with left-sided sciatica 06/22/2014   Home Medication(s) Prior to Admission medications   Medication Sig Start Date End Date Taking? Authorizing Provider  cyclobenzaprine (FLEXERIL) 10 MG tablet Take 1 tablet (10 mg total) by mouth every 8 (eight) hours as needed. 06/20/14   Hudnall, Azucena Fallen, MD  meloxicam (MOBIC) 15 MG tablet Take 1 tablet (15 mg total) by mouth daily. 06/20/14   Lenda Kelp, MD  methylPREDNIsolone (MEDROL DOSPACK) 4 MG tablet follow package directions 06/09/14   Smitty Cords, DO                                                                                                                                    Past Surgical History Past Surgical History:  Procedure Laterality Date  . ELBOW SURGERY  Cholecystectomy    2005  06/14/2014   Family History History reviewed. No pertinent family history.  Social History Social History   Tobacco Use  . Smoking  status: Current Every Day Smoker    Packs/day: 0.50  Substance Use Topics  . Alcohol use: Not on file  . Drug use: Not on file   Allergies Patient has no known allergies.  Review of Systems Review of Systems  Constitutional: Negative for fever.  HENT:       Dry mouth  Gastrointestinal: Positive for abdominal pain. Negative for anorexia, constipation, diarrhea, hematochezia, melena, nausea and vomiting.  Endocrine: Positive for polydipsia and polyuria.  Genitourinary: Negative for dysuria.   All other systems are reviewed and are negative for acute change except as noted in the HPI  Physical Exam Vital Signs  I have reviewed the triage vital signs BP (!) 148/102   Pulse (!) 108   Temp 98.7 F (37.1 C)   Resp 18   Ht 5\' 7"  (1.702 m)   Wt 82.6 kg   SpO2 99%   BMI 28.51 kg/m   Physical Exam  Constitutional: He is oriented to person,  place, and time. He appears well-developed and well-nourished. No distress.  HENT:  Head: Normocephalic and atraumatic.  Right Ear: External ear normal.  Left Ear: External ear normal.  Nose: Nose normal.  Mouth/Throat: Mucous membranes are normal. No trismus in the jaw.  Eyes: Pupils are equal, round, and reactive to light. Conjunctivae and EOM are normal. Right eye exhibits no discharge. Left eye exhibits no discharge. No scleral icterus.  Neck: Normal range of motion and phonation normal. Neck supple.  Cardiovascular: Normal rate and regular rhythm. Exam reveals no gallop and no friction rub.  No murmur heard. Pulmonary/Chest: Effort normal and breath sounds normal. No stridor. No respiratory distress. He has no rales.  Abdominal: Soft. He exhibits no distension. There is no tenderness. There is no rigidity, no rebound, no guarding and no CVA tenderness.  Musculoskeletal: Normal range of motion. He exhibits no edema or tenderness.  Neurological: He is alert and oriented to person, place, and time.  Skin: Skin is warm and dry. No rash  noted. He is not diaphoretic. No erythema.  Psychiatric: He has a normal mood and affect. His behavior is normal.  Vitals reviewed.   ED Results and Treatments Labs (all labs ordered are listed, but only abnormal results are displayed) Labs Reviewed  CBC WITH DIFFERENTIAL/PLATELET - Abnormal; Notable for the following components:      Result Value   RBC 5.83 (*)    Hemoglobin 18.1 (*)    MCHC 37.9 (*)    All other components within normal limits  LIPASE, BLOOD - Abnormal; Notable for the following components:   Lipase 832 (*)    All other components within normal limits  COMPREHENSIVE METABOLIC PANEL - Abnormal; Notable for the following components:   Sodium 131 (*)    Chloride 95 (*)    Glucose, Bld 464 (*)    All other components within normal limits  CBG MONITORING, ED - Abnormal; Notable for the following components:   Glucose-Capillary 382 (*)    All other components within normal limits                                                                                                                         EKG  EKG Interpretation  Date/Time:  Sunday July 26 2018 00:15:53 EDT Ventricular Rate:  79 PR Interval:    QRS Duration: 78 QT Interval:  348 QTC Calculation: 399 R Axis:   68 Text Interpretation:  Sinus rhythm NO STEMI No old tracing to compare Confirmed by Drema Pryardama, Lateasha Breuer [phone]([id number]) on 07/26/2018 12:58:05 AM      Radiology No results found. Pertinent labs & imaging results that were available during my care of the patient were reviewed by me and considered in my medical decision making (see chart for details).  Medications Ordered in ED Medications  living well with diabetes book MISC (has no administration in time range)  gi cocktail (Maalox,Lidocaine,Donnatal) (30 mLs Oral Given 07/26/18 0028)  sodium chloride 0.9 % bolus  1,000 mL (0 mLs Intravenous Stopped 07/26/18 0332)  metFORMIN (GLUCOPHAGE) tablet 500 mg (500 mg Oral Given 07/26/18 0215)  living well  with diabetes book- in spanish ( Does not apply Given 07/26/18 0240)                                                                                                                                    Procedures Procedures  (including critical care time)  Medical Decision Making / ED Course I have reviewed the nursing notes for this encounter and the patient's prior records (if available in EHR or on provided paperwork).    2 weeks of epigastric abdominal pain.  No tenderness to palpation.  Also endorsing polydipsia, polyuria, dry mouth.  Reports family history of diabetes.  No personal history of such.  Screening labs obtain consistent with hyperglycemia without evidence of DKA or HHS.  Likely new onset diabetes.  Evidence of hemoconcentration.  Lipase currently pending, which had to be sent out to confirm level.  Treated with IV fluids.   Lipase elevated greater than 800.  Case discussed with medicine for admission for continued work-up and management.    Final Clinical Impression(s) / ED Diagnoses Final diagnoses:  Acute pancreatitis, unspecified complication status, unspecified pancreatitis type  Hyperglycemia      This chart was dictated using voice recognition software.  Despite best efforts to proofread,  errors can occur which can change the documentation meaning.   Nira Conn, MD 07/26/18 [phone]

## 2018-07-25 NOTE — ED Triage Notes (Addendum)
Presents with 6 days of constant epigastric pressure that radiates into hid back. NOthing makes pain better, nothing makes pain worse. Denies sob, dizziness. Endorses feeling like the pain is choking at times. The pain is severe at night while lying down.

## 2018-07-26 ENCOUNTER — Encounter (HOSPITAL_COMMUNITY): Payer: Self-pay

## 2018-07-26 DIAGNOSIS — R739 Hyperglycemia, unspecified: Secondary | ICD-10-CM

## 2018-07-26 DIAGNOSIS — K859 Acute pancreatitis without necrosis or infection, unspecified: Principal | ICD-10-CM

## 2018-07-26 LAB — LIPASE, BLOOD: Lipase: 832 U/L — ABNORMAL HIGH (ref 11–51)

## 2018-07-26 LAB — GLUCOSE, CAPILLARY
GLUCOSE-CAPILLARY: 351 mg/dL — AB (ref 70–99)
Glucose-Capillary: 333 mg/dL — ABNORMAL HIGH (ref 70–99)
Glucose-Capillary: 237 mg/dL — ABNORMAL HIGH (ref 70–99)
Glucose-Capillary: 226 mg/dL — ABNORMAL HIGH (ref 70–99)

## 2018-07-26 LAB — LIPID PANEL
CHOL/HDL RATIO: 4.9 ratio
CHOLESTEROL: 117 mg/dL (ref 0–200)
Cholesterol: 180 mg/dL (ref 0–200)
HDL: 26 mg/dL — ABNORMAL LOW (ref >40)
HDL: 24 mg/dL — ABNORMAL LOW (ref >40)
LDL CALC: UNDETERMINED mg/dL (ref 0–99)
LDL Cholesterol: 35 mg/dL (ref 0–99)
Total CHOL/HDL Ratio: 6.9 RATIO
Triglycerides: 940 mg/dL — ABNORMAL HIGH (ref <150)
Triglycerides: 289 mg/dL — ABNORMAL HIGH (ref <150)
VLDL: UNDETERMINED mg/dL (ref 0–40)
VLDL: 58 mg/dL — ABNORMAL HIGH (ref 0–40)

## 2018-07-26 LAB — HEMOGLOBIN A1C
HEMOGLOBIN A1C: 12.7 % — AB (ref 4.8–5.6)
HEMOGLOBIN A1C: 12.7 % — AB (ref 4.8–5.6)
MEAN PLASMA GLUCOSE: 317.79 mg/dL
Mean Plasma Glucose: 317.79 mg/dL

## 2018-07-26 LAB — COMPREHENSIVE METABOLIC PANEL
ALK PHOS: 91 U/L (ref 38–126)
ALT: 34 U/L (ref 0–44)
AST: 22 U/L (ref 15–41)
Albumin: 4.4 g/dL (ref 3.5–5.0)
Anion gap: 10 (ref 5–15)
BUN: 13 mg/dL (ref 6–20)
CALCIUM: 9.1 mg/dL (ref 8.9–10.3)
CO2: 26 mmol/L (ref 22–32)
Chloride: 95 mmol/L — ABNORMAL LOW (ref 98–111)
Creatinine, Ser: 0.73 mg/dL (ref 0.61–1.24)
GFR calc Af Amer: >60 mL/min (ref >60)
GFR calc non Af Amer: >60 mL/min (ref >60)
GLUCOSE: 464 mg/dL — AB (ref 70–99)
Potassium: 4.1 mmol/L (ref 3.5–5.1)
SODIUM: 131 mmol/L — AB (ref 135–145)
Total Bilirubin: 0.4 mg/dL (ref 0.3–1.2)
Total Protein: 6.9 g/dL (ref 6.5–8.1)

## 2018-07-26 LAB — CBC WITH DIFFERENTIAL/PLATELET
Basophils Absolute: 0 10*3/uL (ref 0.0–0.1)
Basophils Relative: 0 %
EOS ABS: 0.1 10*3/uL (ref 0.0–0.7)
Eosinophils Relative: 2 %
HEMATOCRIT: 47.8 % (ref 39.0–52.0)
Hemoglobin: 18.1 g/dL — ABNORMAL HIGH (ref 13.0–17.0)
LYMPHS ABS: 2.4 10*3/uL (ref 0.7–4.0)
Lymphocytes Relative: 36 %
MCH: 31 pg (ref 26.0–34.0)
MCHC: 37.9 g/dL — AB (ref 30.0–36.0)
MCV: 82 fL (ref 78.0–100.0)
Monocytes Absolute: 0.6 10*3/uL (ref 0.1–1.0)
Monocytes Relative: 9 %
NEUTROS PCT: 53 %
Neutro Abs: 3.6 10*3/uL (ref 1.7–7.7)
Platelets: 166 10*3/uL (ref 150–400)
RBC: 5.83 MIL/uL — ABNORMAL HIGH (ref 4.22–5.81)
RDW: 12 % (ref 11.5–15.5)
WBC: 6.8 10*3/uL (ref 4.0–10.5)

## 2018-07-26 LAB — CBG MONITORING, ED: Glucose-Capillary: 382 mg/dL — ABNORMAL HIGH (ref 70–99)

## 2018-07-26 MED ORDER — METFORMIN HCL 500 MG PO TABS
500.0000 mg | ORAL_TABLET | Freq: Once | ORAL | Status: AC
  Administered 2018-07-26: 500 mg via ORAL
  Filled 2018-07-26: qty 1

## 2018-07-26 MED ORDER — LIVING WELL WITH DIABETES BOOK
Status: AC
  Filled 2018-07-26: qty 1

## 2018-07-26 MED ORDER — ENOXAPARIN SODIUM 40 MG/0.4ML ~~LOC~~ SOLN: 40.0000 mg | SUBCUTANEOUS | Status: DC

## 2018-07-26 MED ORDER — LIVING WELL WITH DIABETES BOOK - IN SPANISH
Freq: Once | Status: AC
  Administered 2018-07-26: 03:00:00
  Filled 2018-07-26: qty 1

## 2018-07-26 MED ORDER — SODIUM CHLORIDE 0.9 % IV BOLUS
1000.0000 mL | Freq: Once | INTRAVENOUS | Status: AC
  Administered 2018-07-26: 1000 mL via INTRAVENOUS

## 2018-07-26 MED ORDER — ENOXAPARIN SODIUM 40 MG/0.4ML ~~LOC~~ SOLN
40.0000 mg | SUBCUTANEOUS | Status: DC
  Administered 2018-07-26 – 2018-07-27 (×2): 40 mg via SUBCUTANEOUS
  Filled 2018-07-26 (×2): qty 0.4

## 2018-07-26 MED ORDER — INSULIN ASPART 100 UNIT/ML ~~LOC~~ SOLN: 0.0000 [IU] | Freq: Four times a day (QID) | SUBCUTANEOUS | Status: DC

## 2018-07-26 MED ORDER — SODIUM CHLORIDE 0.9 % IV SOLN
INTRAVENOUS | Status: DC
  Administered 2018-07-26 – 2018-07-27 (×4): via INTRAVENOUS

## 2018-07-26 MED ORDER — INSULIN GLARGINE 100 UNIT/ML ~~LOC~~ SOLN
10.0000 [IU] | Freq: Every day | SUBCUTANEOUS | Status: DC
  Administered 2018-07-26 – 2018-07-27 (×2): 10 [IU] via SUBCUTANEOUS
  Filled 2018-07-26 (×2): qty 0.1

## 2018-07-26 MED ORDER — INSULIN ASPART 100 UNIT/ML ~~LOC~~ SOLN
0.0000 [IU] | Freq: Three times a day (TID) | SUBCUTANEOUS | Status: DC
  Administered 2018-07-26: 3 [IU] via SUBCUTANEOUS
  Administered 2018-07-26: 9 [IU] via SUBCUTANEOUS
  Administered 2018-07-26: 3 [IU] via SUBCUTANEOUS
  Administered 2018-07-27 (×2): 2 [IU] via SUBCUTANEOUS
  Administered 2018-07-27: 3 [IU] via SUBCUTANEOUS

## 2018-07-26 NOTE — H&P (Signed)
History and Physical    Rodney Fisher:[id number] DOB: 22-Jan-1981 DOA: 07/25/2018  I have briefly reviewed the patient's prior medical records in [facility] Link  PCP: Patient, No Pcp Per  Patient coming from: home  Chief Complaint: abdominal pain  HPI: Rodney Fisher is a 37 y.o. male without significant medical problems presents to the ER with complaints of epigastric abdominal pain.  He tells me that he has had mild epigastric abdominal pain radiating into his back for the past 5 to 6 days, it is not severe but not improving or going away and decided to get it checked out.  He denies any nausea vomiting and is able to eat.  He denies any fever or chills, no chest pain, no shortness of breath.  He has a history of cholecystectomy and his gallbladder was removed in 2005.  He is not a drinker and has may be couple drinks a week socially.  He smokes tobacco.  He denies any lightheadedness or dizziness  ED Course: in the ED his vitals are stable, blood work reveals elevated CBG to 382 without evidence of DKA, Hb is 18. Lipase is elevated at 832. We are asked to admit for acute pancreatitis and new onset diabetes.   Review of Systems: As per HPI otherwise 10 point review of systems negative.   History reviewed. No pertinent past medical history.  Past Surgical History:  Procedure Laterality Date  . ELBOW SURGERY  06/14/2014     reports that he has been smoking. He has been smoking about 0.50 packs per day. He does not have any smokeless tobacco history on file. His alcohol and drug histories are not on file.  No Known Allergies  History reviewed. No pertinent family history.  Prior to Admission medications   Medication Sig Start Date End Date Taking? Authorizing Provider  cyclobenzaprine (FLEXERIL) 10 MG tablet Take 1 tablet (10 mg total) by mouth every 8 (eight) hours as needed. 06/20/14   Hudnall, Azucena Fallen, MD  meloxicam (MOBIC) 15 MG tablet Take 1 tablet (15 mg total) by mouth daily.  06/20/14   Lenda Kelp, MD  methylPREDNIsolone (MEDROL DOSPACK) 4 MG tablet follow package directions 06/09/14   Smitty Cords, DO    Physical Exam: Vitals:   07/26/18 0448 07/26/18 0502 07/26/18 0545 07/26/18 0630  BP: (!) 141/92 (!) 130/96 124/90 (!) 136/98  Pulse: 100 99 81 (!) 111  Resp: (!) 22 (!) 21 16 17   Temp:      SpO2: 93% 97% 96% 99%  Weight:      Height:        Constitutional: NAD, calm, comfortable Eyes: PERRL, lids and conjunctivae normal ENMT: Mucous membranes are moist. Posterior pharynx clear of any exudate or lesions.Normal dentition.  Neck: normal, supple, no masses, no thyromegaly Respiratory: clear to auscultation bilaterally, no wheezing, no crackles. Normal respiratory effort. No accessory muscle use.  Cardiovascular: Regular rate and rhythm, no murmurs / rubs / gallops. No extremity edema. 2+ pedal pulses.  Abdomen: Very mild epigastric tenderness without guarding or rebound Musculoskeletal: no clubbing / cyanosis. Normal muscle tone.  Skin: no rashes, lesions, ulcers. No induration Neurologic: CN 2-12 grossly intact. Strength 5/5 in all 4.  Psychiatric: Normal judgment and insight. Alert and oriented x 3. Normal mood.   Labs on Admission: I have personally reviewed following labs and imaging studies  CBC: Recent Labs  Lab 07/26/18 0030  WBC 6.8  NEUTROABS 3.6  HGB 18.1*  HCT 47.8  MCV 82.0  PLT 166   Basic Metabolic Panel: Recent Labs  Lab 07/26/18 0111  NA 131*  K 4.1  CL 95*  CO2 26  GLUCOSE 464*  BUN 13  CREATININE 0.73  CALCIUM 9.1   GFR: Estimated Creatinine Clearance: 130 mL/min (by C-G formula based on SCr of 0.73 mg/dL). Liver Function Tests: Recent Labs  Lab 07/26/18 0111  AST 22  ALT 34  ALKPHOS 91  BILITOT 0.4  PROT 6.9  ALBUMIN 4.4   Recent Labs  Lab 07/26/18 0111  LIPASE 832*   No results for input(s): AMMONIA in the last 168 hours. Coagulation Profile: No results for input(s): INR, PROTIME in  the last 168 hours. Cardiac Enzymes: No results for input(s): CKTOTAL, CKMB, CKMBINDEX, TROPONINI in the last 168 hours. BNP (last 3 results) No results for input(s): PROBNP in the last 8760 hours. HbA1C: No results for input(s): HGBA1C in the last 72 hours. CBG: Recent Labs  Lab 07/26/18 0303  GLUCAP 382*   Lipid Profile: No results for input(s): CHOL, HDL, LDLCALC, TRIG, CHOLHDL, LDLDIRECT in the last 72 hours. Thyroid Function Tests: No results for input(s): TSH, T4TOTAL, FREET4, T3FREE, THYROIDAB in the last 72 hours. Anemia Panel: No results for input(s): VITAMINB12, FOLATE, FERRITIN, TIBC, IRON, RETICCTPCT in the last 72 hours. Urine analysis: No results found for: COLORURINE, APPEARANCEUR, LABSPEC, PHURINE, GLUCOSEU, HGBUR, BILIRUBINUR, KETONESUR, PROTEINUR, UROBILINOGEN, NITRITE, LEUKOCYTESUR   Radiological Exams on Admission: No results found.  EKG: Independently reviewed. Sinus rhythm  Assessment/Plan Active Problems:   Acute pancreatitis    Acute pancreatitis -He is post cholecystectomy and not drinking alcohol, no clear etiology right now -Obtain a lipid panel -IVF, pain control -We will allow clear liquids as patient has no nausea or vomiting  Probable new onset DM -cover with Lantus x 1, place on SSI Q6 today while NPO -obtain A1C   DVT prophylaxis: Lovenox  Code Status: Full code  Family Communication: no family at bedside Disposition Plan: home when ready  Consults called: none     Admission status: observation   At the point of initial evaluation, it is my clinical opinion that admission for OBSERVATION is reasonable and necessary because the patient's presenting complaints in the context of their chronic conditions represent sufficient risk of deterioration or significant morbidity to constitute reasonable grounds for close observation in the hospital setting, but that the patient may be medically stable for discharge from the hospital within  24 to 48 hours.    Pamella Pertostin Neely Cecena, MD [facility] Pager [phone]  If 7PM-7AM, please contact night-coverage [email or web address] Password [email or web address]  07/26/2018, 7:57 AM

## 2018-07-26 NOTE — ED Notes (Signed)
Report to [facility].  

## 2018-07-26 NOTE — Progress Notes (Signed)
New Admission Note:  Pt arrived with [facility] Arrival Method: Stretcher Mental Orientation: Alert 7Oriented Assessment:In progress Skin:Intact IV: In place Pain:0/10 Safety Measures: Sifde rails up x 2 Admission: In progress 5E Orientation:Completed Family:  To arrive  Orders have been reviewed and implemented.  Will continue to monitor the patient.

## 2018-07-26 NOTE — ED Notes (Addendum)
Attempted to call report to the floor. RN Caryl Aspkellie

## 2018-07-27 ENCOUNTER — Encounter (HOSPITAL_COMMUNITY): Payer: Self-pay

## 2018-07-27 DIAGNOSIS — Z79899 Other long term (current) drug therapy: Secondary | ICD-10-CM | POA: Diagnosis not present

## 2018-07-27 DIAGNOSIS — R739 Hyperglycemia, unspecified: Secondary | ICD-10-CM | POA: Diagnosis present

## 2018-07-27 DIAGNOSIS — F17210 Nicotine dependence, cigarettes, uncomplicated: Secondary | ICD-10-CM | POA: Diagnosis present

## 2018-07-27 DIAGNOSIS — E1165 Type 2 diabetes mellitus with hyperglycemia: Secondary | ICD-10-CM | POA: Diagnosis present

## 2018-07-27 DIAGNOSIS — K859 Acute pancreatitis without necrosis or infection, unspecified: Secondary | ICD-10-CM | POA: Diagnosis present

## 2018-07-27 LAB — COMPREHENSIVE METABOLIC PANEL
ALT: 46 U/L — ABNORMAL HIGH (ref 0–44)
ANION GAP: 5 (ref 5–15)
AST: 41 U/L (ref 15–41)
Albumin: 3.1 g/dL — ABNORMAL LOW (ref 3.5–5.0)
Alkaline Phosphatase: 66 U/L (ref 38–126)
BILIRUBIN TOTAL: 0.8 mg/dL (ref 0.3–1.2)
BUN: 5 mg/dL — ABNORMAL LOW (ref 6–20)
CHLORIDE: 109 mmol/L (ref 98–111)
CO2: 25 mmol/L (ref 22–32)
Calcium: 8 mg/dL — ABNORMAL LOW (ref 8.9–10.3)
Creatinine, Ser: 0.6 mg/dL — ABNORMAL LOW (ref 0.61–1.24)
GFR calc Af Amer: >60 mL/min (ref >60)
Glucose, Bld: 191 mg/dL — ABNORMAL HIGH (ref 70–99)
POTASSIUM: 3.8 mmol/L (ref 3.5–5.1)
Sodium: 139 mmol/L (ref 135–145)
TOTAL PROTEIN: 5.2 g/dL — AB (ref 6.5–8.1)

## 2018-07-27 LAB — GLUCOSE, CAPILLARY
GLUCOSE-CAPILLARY: 203 mg/dL — AB (ref 70–99)
GLUCOSE-CAPILLARY: 157 mg/dL — AB (ref 70–99)
Glucose-Capillary: 178 mg/dL — ABNORMAL HIGH (ref 70–99)

## 2018-07-27 LAB — CBC
HEMATOCRIT: 45.3 % (ref 39.0–52.0)
HEMOGLOBIN: 16 g/dL (ref 13.0–17.0)
MCH: 30.7 pg (ref 26.0–34.0)
MCHC: 35.3 g/dL (ref 30.0–36.0)
MCV: 86.8 fL (ref 78.0–100.0)
Platelets: 152 10*3/uL (ref 150–400)
RBC: 5.22 MIL/uL (ref 4.22–5.81)
RDW: 12 % (ref 11.5–15.5)
WBC: 5.1 10*3/uL (ref 4.0–10.5)

## 2018-07-27 LAB — LIPASE, BLOOD: Lipase: 148 U/L — ABNORMAL HIGH (ref 11–51)

## 2018-07-27 MED ORDER — LIVING WELL WITH DIABETES BOOK - IN SPANISH
Freq: Once | Status: AC
  Administered 2018-07-27: 17:00:00
  Filled 2018-07-27: qty 1

## 2018-07-27 MED ORDER — INSULIN ASPART 100 UNIT/ML FLEXPEN: 0.0000 [IU] | PEN_INJECTOR | Freq: Three times a day (TID) | SUBCUTANEOUS | 0 refills | Status: DC

## 2018-07-27 MED ORDER — INSULIN STARTER KIT- PEN NEEDLES (ENGLISH): 1.0000 | Freq: Once | 0 refills | Status: AC

## 2018-07-27 MED ORDER — INSULIN ASPART 100 UNIT/ML FLEXPEN
0.0000 [IU] | PEN_INJECTOR | Freq: Three times a day (TID) | SUBCUTANEOUS | Status: DC
  Filled 2018-07-27: qty 3

## 2018-07-27 MED ORDER — LIVING WELL WITH DIABETES BOOK
Freq: Once | Status: DC
  Filled 2018-07-27: qty 1

## 2018-07-27 MED ORDER — INSULIN GLARGINE 100 UNIT/ML ~~LOC~~ SOLN: 10.0000 [IU] | Freq: Every day | SUBCUTANEOUS | 0 refills | Status: DC

## 2018-07-27 MED ORDER — INSULIN STARTER KIT- PEN NEEDLES (SPANISH)
1.0000 | Freq: Once | Status: AC
  Administered 2018-07-27: 1
  Filled 2018-07-27: qty 1

## 2018-07-27 MED ORDER — BLOOD GLUCOSE MONITOR KIT: PACK | 0 refills | Status: DC

## 2018-07-27 MED ORDER — LIVING WELL WITH DIABETES BOOK - IN SPANISH: 1.0000 | Freq: Once | 0 refills | Status: AC

## 2018-07-27 MED ORDER — INSULIN GLARGINE 100 UNIT/ML SOLOSTAR PEN: 30.0000 [IU] | PEN_INJECTOR | Freq: Every day | SUBCUTANEOUS | 0 refills | Status: DC

## 2018-07-27 MED ORDER — INSULIN ASPART 100 UNIT/ML ~~LOC~~ SOLN: 0.0000 [IU] | Freq: Three times a day (TID) | SUBCUTANEOUS | 0 refills | Status: DC

## 2018-07-27 MED ORDER — INSULIN STARTER KIT- PEN NEEDLES (SPANISH): 1.0000 | Freq: Once | 0 refills | Status: AC

## 2018-07-27 MED ORDER — LIVING WELL WITH DIABETES BOOK: 1.0000 | Freq: Once | 0 refills | Status: AC

## 2018-07-27 MED ORDER — INSULIN STARTER KIT- PEN NEEDLES (ENGLISH)
1.0000 | Freq: Once | Status: DC
  Filled 2018-07-27: qty 1

## 2018-07-27 NOTE — Discharge Instructions (Signed)
Acute Pancreatitis Acute pancreatitis is a condition in which the pancreas suddenly gets irritated and swollen (has inflammation). The pancreas is a large gland behind the stomach. It makes enzymes that help to digest food. The pancreas also makes hormones that help to control your blood sugar. Acute pancreatitis happens when the enzymes attack the pancreas and damage it. Most attacks last a couple of days and can cause serious problems. Follow these instructions at home: Eating and drinking  Follow instructions from your doctor about diet. You may need to: ? Avoid alcohol. ? Limit how much fat is in your diet.  Eat small meals often. Avoid eating big meals.  Drink enough fluid to keep your pee (urine) clear or pale yellow.  Do not drink alcohol if it caused your condition. General instructions  Take over-the-counter and prescription medicines only as told by your doctor.  Do not use any tobacco products. These include cigarettes, chewing tobacco, and e-cigarettes. If you need help quitting, ask your doctor.  Get plenty of rest.  If directed, check your blood sugar at home as told by your doctor.  Keep all follow-up visits as told by your doctor. This is important. Contact a doctor if:  You do not get better as quickly as expected.  You have new symptoms.  Your symptoms get worse.  You have lasting pain or weakness.  You continue to feel sick to your stomach (nauseous).  You get better and then you have another pain attack.  You have a fever. Get help right away if:  You cannot eat or keep fluids down.  Your pain becomes very bad.  Your skin or the white part of your eyes turns yellow (jaundice).  You throw up (vomit).  You feel dizzy or you pass out (faint).  Your blood sugar is high (over 300 mg/dL). This information is not intended to replace advice given to you by your health care provider. Make sure you discuss any questions you have with your health care  provider. Document Released: 04/08/2008 Document Revised: 03/28/2016 Document Reviewed: 07/25/2015 Elsevier Interactive Patient Education  2018 [employer].   Acute Pancreatitis Acute pancreatitis is a condition in which the pancreas suddenly gets irritated and swollen (has inflammation). The pancreas is a large gland behind the stomach. It makes enzymes that help to digest food. The pancreas also makes hormones that help to control your blood sugar. Acute pancreatitis happens when the enzymes attack the pancreas and damage it. Most attacks last a couple of days and can cause serious problems. Follow these instructions at home: Eating and drinking  Follow instructions from your doctor about diet. You may need to: ? Avoid alcohol. ? Limit how much fat is in your diet.  Eat small meals often. Avoid eating big meals.  Drink enough fluid to keep your pee (urine) clear or pale yellow.  Do not drink alcohol if it caused your condition. General instructions  Take over-the-counter and prescription medicines only as told by your doctor.  Do not use any tobacco products. These include cigarettes, chewing tobacco, and e-cigarettes. If you need help quitting, ask your doctor.  Get plenty of rest.  If directed, check your blood sugar at home as told by your doctor.  Keep all follow-up visits as told by your doctor. This is important. Contact a doctor if:  You do not get better as quickly as expected.  You have new symptoms.  Your symptoms get worse.  You have lasting pain or weakness.  You  continue to feel sick to your stomach (nauseous).  You get better and then you have another pain attack.  You have a fever. Get help right away if:  You cannot eat or keep fluids down.  Your pain becomes very bad.  Your skin or the white part of your eyes turns yellow (jaundice).  You throw up (vomit).  You feel dizzy or you pass out (faint).  Your blood sugar is high (over 300  mg/dL). This information is not intended to replace advice given to you by your health care provider. Make sure you discuss any questions you have with your health care provider. Document Released: 04/08/2008 Document Revised: 03/28/2016 Document Reviewed: 07/25/2015 Elsevier Interactive Patient Education  2018 [employer] Inc.  Blood Osmolality Test Why am I having this test? The blood osmolality test is performed to measure the balance between dissolved particles and water in your blood. As the amount of water in your blood increases, the relative amount of particles decreases. When this happens, your blood becomes less concentrated (diluted). However, when the amount of water decreases in your blood, the relative amount of dissolved particles increases. When this happens, your blood becomes more concentrated. This fluid-particle balance is controlled by hormones in your blood, such as antidiuretic hormone (ADH). ADH acts on your kidneys to control how much fluid is kept in your bloodstream and how much is lost through urination. Your health care provider may ask you to have this test to determine if you have an imbalance between water and important particles in your blood, such as salt (sodium), sugar (glucose), and calcium. This test may also be used to help evaluate the following conditions:  Seizure disorders.  Swelling in the abdomen (ascites).  Not having enough water in your body (dehydration).  Acid-base imbalance in your blood.  Problems with ADH formation or function.  Kidney disease.  Possible poisoning.  What kind of sample is taken? A blood sample is required for this test. It is usually collected by inserting a needle into a vein or by sticking a finger with a small needle. How do I prepare for this test? There is no preparation required for this test. What do the results mean? Your results will be compared with a reference range of values for this test. The reference  ranges for blood osmolality are as follows:  Adult or elderly: 285-295 mOsm/kg water or 285-295 mmol/kg (SI units).  Child: 275-290 mOsm/kg water.  Results that are higher than the reference range may indicate a number of health conditions. These may include:  Higher-than-normal level of sodium in your blood (hypernatremia).  Higher-than-normal level of glucose in your blood (hyperglycemia).  Very high levels of glucose in your blood without ketones in your urine (hyperosmolar hyperglycemic nonketotic syndrome, HHNS).  Excess ketones in your body resulting from the breakdown of fat (ketosis).  Higher-than-normal level of urea, creatinine, and other nitrogen compounds in your blood (azotemia).  Dehydration.  Mannitol therapy.  Buildup of urea in your blood due to decreased kidney function (uremia).  Diabetes insipidus.  Kidney disease.  Kidney infection.  Recent ingestion of or poisoning with: ? Ethanol. ? Methanol. ? Antifreeze (ethylene glycol).  Results that are lower than the reference range may also indicate a number of health conditions. These may include:  Having too much water in your blood (overhydration).  Syndrome of inappropriate ADH (SIADH).  Complication of some types of cancer.  Reference values may vary among different labs and hospitals. Talk to your health  care provider to discuss your results, treatment options, and if necessary, the need for more tests. It is your responsibility to obtain your test results. Ask the lab or department performing the test when and how you will get your results. Talk with your health care provider if you have any questions about your results. Talk with your health care provider to discuss your results, treatment options, and if necessary, the need for more tests. Talk with your health care provider if you have any questions about your results. This information is not intended to replace advice given to you by your health  care provider. Make sure you discuss any questions you have with your health care provider. Document Released: 11/23/2004 Document Revised: 06/24/2016 Document Reviewed: 03/14/2014 Elsevier Interactive Patient Education  2018 [employer].   Hyperglycemia Hyperglycemia is when the sugar (glucose) level in your blood is too high. It may not cause symptoms. If you do have symptoms, they may include warning signs, such as:  Feeling more thirsty than normal.  Hunger.  Feeling tired.  Needing to pee (urinate) more than normal.  Blurry eyesight (vision).  You may get other symptoms as it gets worse, such as:  Dry mouth.  Not being hungry (loss of appetite).  Fruity-smelling breath.  Weakness.  Weight gain or loss that is not planned. Weight loss may be fast.  A tingling or numb feeling in your hands or feet.  Headache.  Skin that does not bounce back quickly when it is lightly pinched and released (poor skin turgor).  Pain in your belly (abdomen).  Cuts or bruises that heal slowly.  High blood sugar can happen to people who do or do not have diabetes. High blood sugar can happen slowly or quickly, and it can be an emergency. Follow these instructions at home: General instructions  Take over-the-counter and prescription medicines only as told by your doctor.  Do not use products that contain nicotine or tobacco, such as cigarettes and e-cigarettes. If you need help quitting, ask your doctor.  Limit alcohol intake to no more than 1 drink per day for nonpregnant women and 2 drinks per day for men. One drink equals 12 oz of beer, 5 oz of wine, or 1 oz of hard liquor.  Manage stress. If you need help with this, ask your doctor.  Keep all follow-up visits as told by your doctor. This is important. Eating and drinking  Stay at a healthy weight.  Exercise regularly, as told by your doctor.  Drink enough fluid, especially when you: ? Exercise. ? Get sick. ? Are in hot  temperatures.  Eat healthy foods, such as: ? Low-fat (lean) proteins. ? Complex carbs (complex carbohydrates), such as whole wheat bread or brown rice. ? Fresh fruits and vegetables. ? Low-fat dairy products. ? Healthy fats.  Drink enough fluid to keep your pee (urine) clear or pale yellow. If you have diabetes:  Make sure you know the symptoms of hyperglycemia.  Follow your diabetes management plan, as told by your doctor. Make sure you: ? Take insulin and medicines as told. ? Follow your exercise plan. ? Follow your meal plan. Eat on time. Do not skip meals. ? Check your blood sugar as often as told. Make sure to check before and after exercise. If you exercise longer or in a different way than you normally do, check your blood sugar more often. ? Follow your sick day plan whenever you cannot eat or drink normally. Make this plan ahead of  time with your doctor.  Share your diabetes management plan with people in your workplace, school, and household.  Check your urine for ketones when you are ill and as told by your doctor.  Carry a card or wear jewelry that says that you have diabetes. Contact a doctor if:  Your blood sugar level is higher than 240 mg/dL (16.113.3 mmol/L) for 2 days in a row.  You have problems keeping your blood sugar in your target range.  High blood sugar happens often for you. Get help right away if:  You have trouble breathing.  You have a change in how you think, feel, or act (mental status).  You feel sick to your stomach (nauseous), and that feeling does not go away.  You cannot stop throwing up (vomiting). These symptoms may be an emergency. Do not wait to see if the symptoms will go away. Get medical help right away. Call your local emergency services (911 in the [place] Do not drive yourself to the hospital. Summary  Hyperglycemia is when the sugar (glucose) level in your blood is too high.  High blood sugar can happen to people who do or do  not have diabetes.  Make sure you drink enough fluids, eat healthy foods, and exercise regularly.  Contact your doctor if you have problems keeping your blood sugar in your target range. This information is not intended to replace advice given to you by your health care provider. Make sure you discuss any questions you have with your health care provider. Document Released: 08/18/2009 Document Revised: 07/08/2016 Document Reviewed: 07/08/2016 Elsevier Interactive Patient Education  2017 [employer]

## 2018-07-27 NOTE — Care Management Note (Signed)
Case Management Note  Patient Details  Name: Rodney Fisher MRN: [id number] Date of Birth: 1981/05/05  Subjective/Objective:                  discharged  Action/Plan: No CM needs present at of discharge.  Expected Discharge Date:  07/27/18               Expected Discharge Plan:  Home/Self Care  In-House Referral:     Discharge planning Services     Post Acute Care Choice:    Choice offered to:     DME Arranged:    DME Agency:     HH Arranged:    HH Agency:     Status of Service:  Completed, signed off  If discussed at [employer] Length of Stay Meetings, dates discussed:    Additional Comments:  Golda AcreDavis, Rhonda Lynn, RN 07/27/2018, 2:02 PM

## 2018-07-27 NOTE — Discharge Summary (Addendum)
Discharge Summary  Rodney Fisher VZC:[id number] DOB: 1981-01-20  PCP: Patient, No Pcp Per  Admit date: 07/25/2018 Discharge date: 07/27/2018  Time spent: 25 MINUTES   Recommendations for Outpatient Follow-up:  1. Follow-up with your primary care provider within 48 hours 2. Take your medications as prescribed  Discharge Diagnoses:  Active Hospital Problems   Diagnosis Date Noted  . Acute pancreatitis 07/26/2018    Resolved Hospital Problems  No resolved problems to display.    Discharge Condition: Stable  Diet recommendation: Resume previous diet  Vitals:   07/27/18 0549 07/27/18 1416  BP: 133/84 (!) 148/95  Pulse: 72 73  Resp: 16 16  Temp: 97.9 F (36.6 C) 98.7 F (37.1 C)  SpO2: 100% 100%    History of present illness:  Rodney Fisher is a 37 y.o. male without significant medical problems presents to the ER with complaints of epigastric abdominal pain for the past 5 to 6 days, it is not severe but not improving or going away and decided to get it checked out.  He denies any nausea vomiting and is able to eat.  in the ED blood work reveals elevated CBG to 382 without evidence of DKA, Hb is 18. Lipase is elevated at 832. We are asked to admit for acute pancreatitis and new onset diabetes.   07/27/2018: Patient seen and examined at his bedside.  He reports feeling better this morning.  No acute events overnight.  Diabetes coordinator consulted and provided education regarding diabetes and insulin management.  Lipase trending down.  Patient requests to go home.  On the day of discharge, the patient was hemodynamically stable.   Hospital Course:  Active Problems:   Acute pancreatitis  Acute pancreatitis -He is post cholecystectomy and not drinking alcohol, no clear etiology right now -Received IVF, pain control -Diet advanced to regular diet  Newly diagnosed type 2 diabetes -A1c 12.7 -Continue Lantus and short-acting insulin  coverage   Procedures:  None  Consultations:  Diabetes coordinator  Discharge Exam: BP (!) 148/95   Pulse 73   Temp 98.7 F (37.1 C) (Oral)   Resp 16   Ht _0  (1.702 m)   Wt 82.6 kg   SpO2 100%   BMI 28.51 kg/m  . General: 37 y.o. year-old male well developed well nourished in no acute distress.  Alert and oriented x3. . Cardiovascular: Regular rate and rhythm with no rubs or gallops.  No thyromegaly or JVD noted. Marland Kitchen Respiratory: Clear to auscultation with no wheezes or rales. Good inspiratory effort. . Abdomen: Soft nontender nondistended with normal bowel sounds x4 quadrants. . Musculoskeletal: No lower extremity edema. 2/4 pulses in all 4 extremities. . Skin: No ulcerative lesions noted or rashes, . Psychiatry: Mood is appropriate for condition and setting  Discharge Instructions You were cared for by a hospitalist during your hospital stay. If you have any questions about your discharge medications or the care you received while you were in the hospital after you are discharged, you can call the unit and asked to speak with the hospitalist on call if the hospitalist that took care of you is not available. Once you are discharged, your primary care physician will handle any further medical issues. Please note that NO REFILLS for any discharge medications will be authorized once you are discharged, as it is imperative that you return to your primary care physician (or establish a relationship with a primary care physician if you do not have one) for your aftercare needs so  that they can reassess your need for medications and monitor your lab values.   Allergies as of 07/27/2018   No Known Allergies     Medication List    STOP taking these medications   cyclobenzaprine 10 MG tablet Commonly known as:  FLEXERIL   meloxicam 15 MG tablet Commonly known as:  MOBIC   methylPREDNIsolone 4 MG tablet Commonly known as:  MEDROL DOSPACK     TAKE these medications   blood  glucose meter kit and supplies Kit Dispense based on patient and insurance preference. Use up to four times daily as directed. (FOR ICD-9 250.00, 250.01).   insulin aspart 100 UNIT/ML FlexPen Commonly known as:  NOVOLOG Inject 0-15 Units into the skin 3 (three) times daily with meals.   Insulin Glargine 100 UNIT/ML Solostar Pen Commonly known as:  LANTUS Inject 30 Units into the skin daily.   insulin starter kit- pen needles Misc 1 kit by Other route once for 1 dose.   insulin starter kit- pen needles Misc 1 kit by Other route once for 1 dose.   living well with diabetes book Misc 1 each by Does not apply route once for 1 dose.   living well with diabetes book- in [place] 1 each by Does not apply route once for 1 dose.      No Known Allergies Follow-up Information    [facility]. Call in 1 day(s).   Why:  Please call for a post hospital follow-up appointment Contact information: [address] [place] [address] [phone]           The results of significant diagnostics from this hospitalization (including imaging, microbiology, ancillary and laboratory) are listed below for reference.    Significant Diagnostic Studies: No results found.  Microbiology: No results found for this or any previous visit (from the past 240 hour(s)).   Labs: Basic Metabolic Panel: Recent Labs  Lab 07/26/18 0111 07/27/18 0608  NA 131* 139  K 4.1 3.8  CL 95* 109  CO2 26 25  GLUCOSE 464* 191*  BUN 13 5*  CREATININE 0.73 0.60*  CALCIUM 9.1 8.0*   Liver Function Tests: Recent Labs  Lab 07/26/18 0111 07/27/18 0608  AST 22 41  ALT 34 46*  ALKPHOS 91 66  BILITOT 0.4 0.8  PROT 6.9 5.2*  ALBUMIN 4.4 3.1*   Recent Labs  Lab 07/26/18 0111 07/27/18 0608  LIPASE 832* 148*   No results for input(s): AMMONIA in the last 168 hours. CBC: Recent Labs  Lab 07/26/18 0030 07/27/18 0608  WBC 6.8 5.1  NEUTROABS 3.6   --   HGB 18.1* 16.0  HCT 47.8 45.3  MCV 82.0 86.8  PLT 166 152   Cardiac Enzymes: No results for input(s): CKTOTAL, CKMB, CKMBINDEX, TROPONINI in the last 168 hours. BNP: BNP (last 3 results) No results for input(s): BNP in the last 8760 hours.  ProBNP (last 3 results) No results for input(s): PROBNP in the last 8760 hours.  CBG: Recent Labs  Lab 07/26/18 1206 07/26/18 1654 07/26/18 1939 07/27/18 0756 07/27/18 1228  GLUCAP 237* 226* 351* 203* 157*       Signed:  Kayleen Memos, MD [facility] 07/27/2018, 4:09 PM

## 2018-07-27 NOTE — Progress Notes (Signed)
Patient given discharge instructions, and verbalized an understanding of all discharge instructions.  Patient agrees with discharge plan, and is being discharged in stable medical condition.  Patient given transportation via wheelchair. 

## 2018-07-27 NOTE — Progress Notes (Signed)
Inpatient Diabetes Program Recommendations  AACE/ADA: New Consensus Statement on Inpatient Glycemic Control (2015)  Target Ranges:  Prepandial:   less than 140 mg/dL      Peak postprandial:   less than 180 mg/dL (1-2 hours)      Critically ill patients:  140 - 180 mg/dL   Spoke with patient about new diabetes diagnosis.  Discussed A1C results (12.7% this admission) and explained what an A1C is. Discussed basic pathophysiology of DM Type 2, basic home care, importance of checking CBGs and maintaining good CBG control to prevent long-term and short-term complications. Reviewed glucose and A1C goals. Reviewed signs and symptoms of hyperglycemia and hypoglycemia along with treatment for both. Discussed impact of nutrition, exercise, stress, sickness, and medications on diabetes control. Reviewed Living Well with diabetes booklet and encouraged patient to read through entire book. Informed patient that he will be prescribed Long acting insulin and possibly an oral medication at time of d/c.  Asked patient to check his glucose 1-2 times per day (fasting and alternating second check) and to keep a log book of glucose readings and insulin taken. Explained how the doctor he follows up with can use the log book to continue to make insulin adjustments if needed. Reviewed and demonstrated how to administer insulin with insulin pen and pen needle. Patient was able to successfully demonstrate how to draw up and administer insulin insulin pen. Informed patient that RN will be asking him to self-administer insulin to ensure proper technique and ability to administer self insulin shots.   Patient verbalized understanding of information discussed and he states that he has no further questions at this time related to diabetes.   RNs to provide ongoing basic DM education at bedside with this patient and engage patient to actively check blood glucose and administer insulin injections. Patient prefers Agricultural engineereducational material written  in spanish.  Thanks, Christena DeemShannon Trae Bovenzi RN, MSN, BC-ADM Inpatient Diabetes Coordinator Team Pager [phone] (8a-5p)

## 2018-08-04 ENCOUNTER — Ambulatory Visit: Payer: BLUE CROSS/BLUE SHIELD | Attending: Family Medicine | Admitting: Family Medicine

## 2018-08-04 VITALS — BP 112/70 | HR 98 | Temp 98.1°F | Resp 17 | Ht 65.0 in | Wt 180.0 lb

## 2018-08-04 DIAGNOSIS — K859 Acute pancreatitis without necrosis or infection, unspecified: Secondary | ICD-10-CM

## 2018-08-04 DIAGNOSIS — E162 Hypoglycemia, unspecified: Secondary | ICD-10-CM | POA: Diagnosis not present

## 2018-08-04 DIAGNOSIS — F17210 Nicotine dependence, cigarettes, uncomplicated: Secondary | ICD-10-CM | POA: Diagnosis not present

## 2018-08-04 DIAGNOSIS — M5442 Lumbago with sciatica, left side: Secondary | ICD-10-CM | POA: Insufficient documentation

## 2018-08-04 DIAGNOSIS — E119 Type 2 diabetes mellitus without complications: Secondary | ICD-10-CM | POA: Diagnosis not present

## 2018-08-04 DIAGNOSIS — F17209 Nicotine dependence, unspecified, with unspecified nicotine-induced disorders: Secondary | ICD-10-CM

## 2018-08-04 DIAGNOSIS — Z6829 Body mass index (BMI) 29.0-29.9, adult: Secondary | ICD-10-CM | POA: Diagnosis not present

## 2018-08-04 DIAGNOSIS — Z794 Long term (current) use of insulin: Secondary | ICD-10-CM | POA: Insufficient documentation

## 2018-08-04 DIAGNOSIS — Z23 Encounter for immunization: Secondary | ICD-10-CM | POA: Diagnosis not present

## 2018-08-04 LAB — GLUCOSE, POCT (MANUAL RESULT ENTRY): POC Glucose: 107 mg/dl — AB (ref 70–99)

## 2018-08-04 MED ORDER — VARENICLINE TARTRATE 1 MG PO TABS: 1.0000 mg | ORAL_TABLET | Freq: Two times a day (BID) | ORAL | 2 refills | Status: DC

## 2018-08-04 MED ORDER — PNEUMOCOCCAL VAC POLYVALENT 25 MCG/0.5ML IJ INJ: 0.5000 mL | INJECTION | INTRAMUSCULAR | 0 refills | Status: AC

## 2018-08-04 MED ORDER — VARENICLINE TARTRATE 0.5 MG X 11 & 1 MG X 42 PO MISC: ORAL | 0 refills | Status: DC

## 2018-08-04 MED ORDER — METFORMIN HCL 500 MG PO TABS: 500.0000 mg | ORAL_TABLET | Freq: Two times a day (BID) | ORAL | 3 refills | Status: DC

## 2018-08-04 NOTE — Progress Notes (Signed)
Rodney Fisher, is a 37 y.o. male  EAV:[id number]  NWG:[id number]  DOB - September 07, 1981  CC:  Chief Complaint  Patient presents with  . New Patient (Initial Visit)  . Hospitalization Follow-up    ED->Hosp 9/21-9/23 for acute pancreatitis  . Diabetes    new onset DM. A1C 12.7 during hospital. states that FSBS have been better 173-242. had a reading as low as 65 so he stopped taking the Novolog       HPI: Rodney Fisher is a 37 y.o. male current daily smoker is here today to establish care and hospital follow-up.  Chronic health problems include:has Low back pain with left-sided sciatica and Acute pancreatitis, gallbladder removed 2005.   Hospital follow-up Rodney Fisher is a 37 y.o. male initially presented to [facility] emergency department with a complaint of severe abdominal pain.  Pain was mostly prominent in the epigastric region.  Pain is been present at that time for 5 to 6 days.  On arrival he was found to have an elevated glucose of 382 and he was found to have an elevated lipase of 832.  Admission diagnoses included new-onset diabetes and acute pancreatitis.  His treatment consisted of n.p.o. with an advancing diet as tolerated.  He received pain control along with IV fluids.  His A1c was 12.7.  He was started on short acting and long-acting insulin and received diabetes education prior to being discharged.  Concerns related to today's visit:  Patient reports that he has been compliant with medication.  However he stopped taking the short acting insulin as he had been dosing himself 15 units of insulin with meals and had an episode of hypoglycemia last week that made him feel awful. Therefore he discontinued short acting and has been administering 30 units of Lantus daily.  Short acting insulin was prescribed on a sliding scale however patient did not get a copy of the sliding scale and how to administer the insulin.  Therefore he is only been administering the highest dose that was indicated on the  prescription which was 15 units. He denies any polyuria, polydipsia, abdominal pain, nausea, vomiting, chest pain, shortness of breath, numbness and tingling in extremities, or new weakness.  He is interested today in smoking cessation would like to start medication to assist with quitting smoking.  He denies any alcohol use.  He reports making efforts to eat better however is requesting information printed out on recommended diet.  He has no other complaints today.  Pertinent family medical history: Patient denies any known family history of cardiovascular disease, diabetes, lung disease, or cancers.  Current medications: Current Outpatient Medications:  .  B-D UF III MINI PEN NEEDLES 31G X 5 MM MISC, USE UTD WITH INSULIN PENS, Disp: , Rfl: 0 .  blood glucose meter kit and supplies KIT, Dispense based on patient and insurance preference. Use up to four times daily as directed. (FOR ICD-9 250.00, 250.01)., Disp: 1 each, Rfl: 0 .  Insulin Glargine (LANTUS) 100 UNIT/ML Solostar Pen, Inject 30 Units into the skin daily., Disp: 15 mL, Rfl: 0 .  ONETOUCH DELICA LANCETS 57Q MISC, USE TO TEST BLOOD SUGAR QID, Disp: , Rfl: 0 .  ONETOUCH VERIO test strip, TEST BLOOD SUGAR UP TO QID UTD, Disp: , Rfl: 0 .  insulin aspart (NOVOLOG) 100 UNIT/ML FlexPen, Inject 0-15 Units into the skin 3 (three) times daily with meals. (Patient not taking: Reported on 08/04/2018), Disp: 15 mL, Rfl: 0   No Known Allergies  Social History  Socioeconomic History  . Marital status: Single    Spouse name: Not on file  . Number of children: Not on file  . Years of education: Not on file  . Highest education level: Not on file  Occupational History  . Not on file  Social Needs  . Financial resource strain: Not on file  . Food insecurity:    Worry: Not on file    Inability: Not on file  . Transportation needs:    Medical: Not on file    Non-medical: Not on file  Tobacco Use  . Smoking status: Current Every Day Smoker     Packs/day: 0.50  . Smokeless tobacco: Never Used  Substance and Sexual Activity  . Alcohol use: Not on file  . Drug use: Never  . Sexual activity: Not on file  Lifestyle  . Physical activity:    Days per week: Not on file    Minutes per session: Not on file  . Stress: Not on file  Relationships  . Social connections:    Talks on phone: Not on file    Gets together: Not on file    Attends religious service: Not on file    Active member of club or organization: Not on file    Attends meetings of clubs or organizations: Not on file    Relationship status: Not on file  . Intimate partner violence:    Fear of current or ex partner: Not on file    Emotionally abused: Not on file    Physically abused: Not on file    Forced sexual activity: Not on file  Other Topics Concern  . Not on file  Social History Narrative  . Not on file   Review of Systems: Constitutional: Negative for fever, chills, diaphoresis, activity change, appetite change and fatigue. HENT: Negative for ear pain, nosebleeds, congestion, facial swelling, rhinorrhea, neck pain, neck stiffness and ear discharge.  Eyes: Negative for pain, discharge, redness, itching and visual disturbance. Respiratory: Negative for cough, choking, chest tightness, shortness of breath, wheezing and stridor.  Cardiovascular: Negative for chest pain, palpitations and leg swelling. Gastrointestinal: Negative for abdominal distention. Genitourinary: Negative for dysuria, urgency, frequency, hematuria Musculoskeletal: Negative for back pain, joint swelling, arthralgia and gait problem. Neurological: Negative for dizziness, tremors, seizures, syncope, facial asymmetry, speech difficulty, weakness, light-headedness, numbness and headaches.  Hematological: Negative for adenopathy. Does not bruise/bleed easily. Psychiatric/Behavioral: Negative for hallucinations, behavioral problems, confusion, dysphoric mood, decreased concentration and agitation.     Objective:   Vitals:   08/04/18 1606  BP: 112/70  Pulse: 98  Resp: 17  Temp: 98.1 F (36.7 C)  SpO2: 98%    Physical Exam: Constitutional: Patient appears well-developed and well-nourished. No distress. HENT: Normocephalic, atraumatic, External right and left ear normal. Oropharynx is clear and moist.  Eyes: Conjunctivae and EOM are normal. PERRLA, no scleral icterus. Neck: Normal ROM. Neck supple. No JVD. No tracheal deviation. No thyromegaly. CVS: RRR, S1/S2 +, no murmurs, no gallops, no carotid bruit.  Pulmonary: Effort and breath sounds normal, no stridor, rhonchi, wheezes, rales.  Abdominal: Soft. BS +, no distension, tenderness, rebound or guarding.  Musculoskeletal: Normal range of motion. No edema and no tenderness.  Neuro: Alert. Normal reflexes, muscle tone coordination. No cranial nerve deficit. Skin: Skin is warm and dry. No rash noted. Not diaphoretic. No erythema. No pallor. Psychiatric: Normal mood and affect. Behavior, judgment, thought content normal.  Lab Results  Component Value Date   WBC 5.1 07/27/2018  HGB 16.0 07/27/2018   HCT 45.3 07/27/2018   MCV 86.8 07/27/2018   PLT 152 07/27/2018   Lab Results  Component Value Date   CREATININE 0.60 (L) 07/27/2018   BUN 5 (L) 07/27/2018   NA 139 07/27/2018   K 3.8 07/27/2018   CL 109 07/27/2018   CO2 25 07/27/2018    Lab Results  Component Value Date   HGBA1C 12.7 (H) 07/26/2018    Lipid Panel     Component Value Date/Time   CHOL 117 07/26/2018 1051   TRIG 289 (H) 07/26/2018 1051   HDL 24 (L) 07/26/2018 1051   CHOLHDL 4.9 07/26/2018 1051   VLDL 58 (H) 07/26/2018 1051   LDLCALC 35 07/26/2018 1051        Assessment and plan:  1. New onset type 2 diabetes mellitus (HCC) - Glucose (CBG) 191 today,Last A1c 12.7.  Patient was confused and did not have the necessary resources to appropriately administer short acting insulin.  He will continue Lantus.  Adding metformin.  He has been placed on a  sliding scale we have reviewed sliding scale he has verbalized understanding as well is able to verbally demonstrate the use of the sliding scale.  Will follow patient closely as he is a new onset diabetic.  We will have him return to office in 2 weeks for a follow-up visit.  Diabetes education materials provided in Spanish and patient given several samples of diets to follow.  2. Acute pancreatitis, unspecified complication status, unspecified pancreatitis type, resolved. will repeat a lipase level in 2 weeks.  Patient is asymptomatic of nausea vomiting or abdominal pain.  This was likely an occurrence related to uncontrolled new onset diabetes.  We will continue to monitor.   3. Need for immunization against influenza - Flu Vaccine QUAD 36+ mos IM  4. Body mass index 29.0-29.9, adult Encourage 150 minutes/week of vigorous activity to facilitate weight loss.  Patient has started on metformin which would likely result in some residual weight loss initially.  5. Tobacco use disorder, continuous, ready to quit stage.  Patient has been started on Chantix starter pack and also prescribed a 1 mg dose daily continuation pack has been completed.  He has verbalized a goal of 90 days to be smoke-free.  We will continue to monitor.   Return for diabetes follow-up in 2 weeks.  Repeat CMP to evaluate liver enzymes and electrolyte status obtain a urine microalbumin.  Ensure medication compliance.  Repeat A1c-if no improvement refer to diabetes education.   The patient was given clear instructions to go to ER or return to medical center if symptoms don't improve, worsen or new problems develop. The patient verbalized understanding. The patient was told to call to get lab results if they haven't heard anything in the next week.     Carroll Sage. Kenton Kingfisher, MSN, [facility]  [address] [place], [place], [place] [address] [phone]  A total of 40 minutes spent, greater than 50 % of this time  was spent counseling and coordination of care.   This note has been created with Surveyor, quantity. Any transcriptional errors are unintentional.

## 2018-08-04 NOTE — Patient Instructions (Addendum)
For your Novolog, I would like for you to administer as follows:  Blood sugar readings 125-do not administer any Novolog before meals 126-180administer 1 unit before meals  181-200 administer 2 units before meals  201-250 administer 4 units before meals 251-300 administer 6 units before meals Greater 301 administer 8 units before meals  and if consistently greater than 300 notify me here at the office via MyChart.  Continue Lantus 30 units at bedtime nightly between 9 pm and 11 pm  For any concerns regarding medications, please contact Claris Che [phone].  We will contact you next week to schedule your two week follow-up. You may contact us next week if you haven't heard from our office my   Primary Care at [facility] [address] [place], [place] [address] Cala [email or web address] [phone]: [phone]   Take your Chantex as follows for smoking cessation: Starting week: 0.5 mg once daily on days 1-3 and 0.5 mg twice daily on days 4-7. Continuing weeks: 1 mg twice daily for a total of 12 weeks. You will receive a starter pack initially and another prescription for 1 mg tablets.  Varenicline oral tablets What is this medicine? VARENICLINE (var EN i kleen) is used to help people quit smoking. It can reduce the symptoms caused by stopping smoking. It is used with a patient support program recommended by your physician. This medicine may be used for other purposes; ask your health care provider or pharmacist if you have questions. COMMON BRAND NAME(S): Chantix What should I tell my health care provider before I take this medicine? They need to know if you have any of these conditions: -bipolar disorder, depression, schizophrenia or other mental illness -heart disease -if you often drink alcohol -kidney disease -peripheral vascular disease -seizures -stroke -suicidal thoughts, plans, or attempt; a previous suicide attempt by you or a family  member -an unusual or allergic reaction to varenicline, other medicines, foods, dyes, or preservatives -pregnant or trying to get pregnant -breast-feeding How should I use this medicine? Take this medicine by mouth after eating. Take with a full glass of water. Follow the directions on the prescription label. Take your doses at regular intervals. Do not take your medicine more often than directed. There are 3 ways you can use this medicine to help you quit smoking; talk to your health care professional to decide which plan is right for you: 1) you can choose a quit date and start this medicine 1 week before the quit date, or, 2) you can start taking this medicine before you choose a quit date, and then pick a quit date between day 8 and 35 days of treatment, or, 3) if you are not sure that you are able or willing to quit smoking right away, start taking this medicine and slowly decrease the amount you smoke as directed by your health care professional with the goal of being cigarette-free by week 12 of treatment. Stick to your plan; ask about support groups or other ways to help you remain cigarette-free. If you are motivated to quit smoking and did not succeed during a previous attempt with this medicine for reasons other than side effects, or if you returned to smoking after this treatment, speak with your health care professional about whether another course of this medicine may be right for you. A special MedGuide will be given to you by the pharmacist with each prescription and refill. Be sure to read this information carefully each time. Talk to your pediatrician regarding the use of this  medicine in children. This medicine is not approved for use in children. Overdosage: If you think you have taken too much of this medicine contact a poison control center or emergency room at once. NOTE: This medicine is only for you. Do not share this medicine with others. What if I miss a dose? If you miss a  dose, take it as soon as you can. If it is almost time for your next dose, take only that dose. Do not take double or extra doses. What may interact with this medicine? -alcohol or any product that contains alcohol -insulin -other stop smoking aids -theophylline -warfarin This list may not describe all possible interactions. Give your health care provider a list of all the medicines, herbs, non-prescription drugs, or dietary supplements you use. Also tell them if you smoke, drink alcohol, or use illegal drugs. Some items may interact with your medicine. What should I watch for while using this medicine? Visit your doctor or health care professional for regular check ups. Ask for ongoing advice and encouragement from your doctor or healthcare professional, friends, and family to help you quit. If you smoke while on this medication, quit again Your mouth may get dry. Chewing sugarless gum or sucking hard candy, and drinking plenty of water may help. Contact your doctor if the problem does not go away or is severe. You may get drowsy or dizzy. Do not drive, use machinery, or do anything that needs mental alertness until you know how this medicine affects you. Do not stand or sit up quickly, especially if you are an older patient. This reduces the risk of dizzy or fainting spells. Sleepwalking can happen during treatment with this medicine, and can sometimes lead to behavior that is harmful to you, other people, or property. Stop taking this medicine and tell your doctor if you start sleepwalking or have other unusual sleep-related activity. Decrease the amount of alcoholic beverages that you drink during treatment with this medicine until you know if this medicine affects your ability to tolerate alcohol. Some people have experienced increased drunkenness (intoxication), unusual or sometimes aggressive behavior, or no memory of things that have happened (amnesia) during treatment with this medicine. The  use of this medicine may increase the chance of suicidal thoughts or actions. Pay special attention to how you are responding while on this medicine. Any worsening of mood, or thoughts of suicide or dying should be reported to your health care professional right away. What side effects may I notice from receiving this medicine? Side effects that you should report to your doctor or health care professional as soon as possible: -allergic reactions like skin rash, itching or hives, swelling of the face, lips, tongue, or throat -acting aggressive, being angry or violent, or acting on dangerous impulses -breathing problems -changes in vision -chest pain or chest tightness -confusion, trouble speaking or understanding -new or worsening depression, anxiety, or panic attacks -extreme increase in activity and talking (mania) -fast, irregular heartbeat -feeling faint or lightheaded, falls -fever -pain in legs when walking -problems with balance, talking, walking -redness, blistering, peeling or loosening of the skin, including inside the mouth -ringing in ears -seeing or hearing things that aren't there (hallucinations) -seizures -sleepwalking -sudden numbness or weakness of the face, arm or leg -thoughts about suicide or dying, or attempts to commit suicide -trouble passing urine or change in the amount of urine -unusual bleeding or bruising -unusually weak or tired Side effects that usually do not require medical attention (report to your  doctor or health care professional if they continue or are bothersome): -constipation -headache -nausea, vomiting -strange dreams -stomach gas -trouble sleeping This list may not describe all possible side effects. Call your doctor for medical advice about side effects. You may report side effects to FDA at 1-800-FDA-1088. Where should I keep my medicine? Keep out of the reach of children. Store at room temperature between 15 and 30 degrees C (59 and 86  degrees F). Throw away any unused medicine after the expiration date. NOTE: This sheet is a summary. It may not cover all possible information. If you have questions about this medicine, talk to your doctor, pharmacist, or health care provider.  2018 Elsevier/Gold Standard (2015-07-06 16:14:23)  Diabetes Mellitus and Exercise Exercising regularly is important for your overall health, especially when you have diabetes (diabetes mellitus). Exercising is not only about losing weight. It has many health benefits, such as increasing muscle strength and bone density and reducing body fat and stress. This leads to improved fitness, flexibility, and endurance, all of which result in better overall health. Exercise has additional benefits for people with diabetes, including:  Reducing appetite.  Helping to lower and control blood glucose.  Lowering blood pressure.  Helping to control amounts of fatty substances (lipids) in the blood, such as cholesterol and triglycerides.  Helping the body to respond better to insulin (improving insulin sensitivity).  Reducing how much insulin the body needs.  Decreasing the risk for heart disease by: ? Lowering cholesterol and triglyceride levels. ? Increasing the levels of good cholesterol. ? Lowering blood glucose levels.  What is my activity plan? Your health care provider or certified diabetes educator can help you make a plan for the type and frequency of exercise (activity plan) that works for you. Make sure that you:  Do at least 150 minutes of moderate-intensity or vigorous-intensity exercise each week. This could be brisk walking, biking, or water aerobics. ? Do stretching and strength exercises, such as yoga or weightlifting, at least 2 times a week. ? Spread out your activity over at least 3 days of the week.  Get some form of physical activity every day. ? Do not go more than 2 days in a row without some kind of physical activity. ? Avoid being  inactive for more than 90 minutes at a time. Take frequent breaks to walk or stretch.  Choose a type of exercise or activity that you enjoy, and set realistic goals.  Start slowly, and gradually increase the intensity of your exercise over time.  What do I need to know about managing my diabetes?  Check your blood glucose before and after exercising. ? If your blood glucose is higher than 240 mg/dL (40.9 mmol/L) before you exercise, check your urine for ketones. If you have ketones in your urine, do not exercise until your blood glucose returns to normal.  Know the symptoms of low blood glucose (hypoglycemia) and how to treat it. Your risk for hypoglycemia increases during and after exercise. Common symptoms of hypoglycemia can include: ? Hunger. ? Anxiety. ? Sweating and feeling clammy. ? Confusion. ? Dizziness or feeling light-headed. ? Increased heart rate or palpitations. ? Blurry vision. ? Tingling or numbness around the mouth, lips, or tongue. ? Tremors or shakes. ? Irritability.  Keep a rapid-acting carbohydrate snack available before, during, and after exercise to help prevent or treat hypoglycemia.  Avoid injecting insulin into areas of the body that are going to be exercised. For example, avoid injecting insulin into: ?  The arms, when playing tennis. ? The legs, when jogging.  Keep records of your exercise habits. Doing this can help you and your health care provider adjust your diabetes management plan as needed. Write down: ? Food that you eat before and after you exercise. ? Blood glucose levels before and after you exercise. ? The type and amount of exercise you have done. ? When your insulin is expected to peak, if you use insulin. Avoid exercising at times when your insulin is peaking.  When you start a new exercise or activity, work with your health care provider to make sure the activity is safe for you, and to adjust your insulin, medicines, or food intake as  needed.  Drink plenty of water while you exercise to prevent dehydration or heat stroke. Drink enough fluid to keep your urine clear or pale yellow. This information is not intended to replace advice given to you by your health care provider. Make sure you discuss any questions you have with your health care provider. Document Released: 01/11/2004 Document Revised: 05/10/2016 Document Reviewed: 04/01/2016 Elsevier Interactive Patient Education  2018 Elsevier Inc.    Madilyn Fireman antineumoccica de polisacridos: Lo que debe saber (Pneumococcal Polysaccharide Vaccine: What You Need to Know) 1. Por qu vacunarse? La vacunacin puede proteger a los [employer] (y a algunos nios y adultos ms jvenes) de la enfermedad neumoccica. La enfermedad neumoccica es causada por una bacteria que puede contagiarse de [place] persona a otra por contacto directo. Puede provocar infecciones en los odos y tambin infecciones ms graves en:  los pulmones (neumona),  la sangre (bacteriemia), y  las membranas que cubren el cerebro y la mdula espinal (meningitis). La meningitis puede causar sordera y dao cerebral, y puede ser mortal. Cualquier persona puede contraer la enfermedad neumoccica, pero los nios menores de 2 aos, las personas con [employer] Financial, los adultos mayores de 65 aos y los fumadores son los que corren un mayor riesgo. Cada ao, mueren alrededor de [id number] adultos mayores a causa de la enfermedad [employer] [place]. El tratamiento de las infecciones neumoccicas con penicilina y otros medicamentos era ms efectivo en el pasado. Sin embargo, algunas cepas de la bacteria que causa la enfermedad se han vuelto resistentes a estos medicamentos. Esto hace que la prevencin de la enfermedad mediante la vacunacin sea an ms importante. 2. Madilyn Fireman antineumoccica de polisacridos (PPSV23) La vacuna antineumoccica de polisacridos (PPSV23) protege contra 23tipos de  bacterias neumoccicas. No previene todas las enfermedades neumoccicas. La vacuna PPSV23 se recomienda para las siguientes personas:  Todos los adultos de 65aos en adelante.  Teresita Madura persona de 2a 64aos que tenga un problema de salud a Air cabin crew.  H[employer] Corporation de 2 a 64 aos que tengan el sistema inmunitario dbil.  Los adultos de 19 a 64 aos que fumen o tengan asma. La Harley[employer]son de las personas solo necesitan una dosis de la vacuna PPSV. Para ciertos grupos de alto riesgo se recomienda una segunda dosis. Las [employer] International de 65 aos deben recibir una dosis de la vacuna, aun si recibieron una o ms dosis antes de [employer]. El mdico puede brindarle ms informacin sobre estas recomendaciones. La mayora de los adultos sanos adquiere proteccin 2 a 3semanas despus de haber recibido la vacuna. 3. Algunas personas no deben recibir la vacuna  Teresita Madura persona que haya sufrido una reaccin alrgica potencialmente mortal a la PPSV no debe recibir otra dosis.  Las personas que tengan alergia grave a los componentes de  la vacuna PPSV no deben recibirla. Informe a su mdico si sufre de algn tipo de alergia grave.  Teresita Madura persona que est enferma en un grado moderado o grave el da en que est programada la vacunacin, probablemente deba esperar a recuperarse para recibirla. Por lo general, una persona con una enfermedad leve puede vacunarse.  Los nios menores de 2 aos no deben recibir [employer].  No hay pruebas de que la vacuna PPSV sea perjudicial para las mujeres embarazadas o el feto. No obstante, como precaucin, las mujeres que necesiten aplicarse la vacuna deben hacerlo antes de quedar embarazadas, si es posible.  4. Riesgos de [place] reaccin a la vacuna Con cualquier medicamento, incluyendo las vacunas, existe la posibilidad de que aparezcan efectos secundarios. Estos son leves y desaparecen por s solos, pero tambin son posibles las reacciones graves. Aproximadamente la  mitad de las personas que reciben la PPSV presentan efectos secundarios leves, como enrojecimiento o Art therapist donde se aplic la vacuna, que desaparecen a los [address] Menos de 1 de cada 100personas presenta fiebre, dolores musculares o reacciones locales ms graves. Problemas que podran ocurrir despus de cualquier vacuna:  Las personas a veces se desmayan despus de un procedimiento mdico, incluso despus de recibir Scientist, forensic. Si permanece sentado o recostado durante 15 minutos puede ayudar a [employer] y las lesiones causadas por las cadas. Informe al mdico si se siente mareado, tiene cambios en la visin o zumbidos en los odos.  Algunas personas sienten un dolor intenso en el hombro y tienen dificultad para mover el brazo donde se coloc la vacuna. Esto sucede con muy poca frecuencia.  Cualquier medicamento puede causar una reaccin alrgica grave. Dichas reacciones son Lynnae Sandhoff poco frecuentes con una vacuna (se calcula que menos de 1en un milln de dosis) y se producen unos minutos a unas horas despus de la vacunacin. Al igual que con cualquier Automatic Data, existe una probabilidad remota de que una vacuna cause una lesin grave o la [place]. Se controla permanentemente la seguridad de las vacunas. Para obtener ms informacin, visite: http://floyd.org/. 5. Qu pasa si hay una reaccin grave? A qu signos debo estar atento? Observe todo lo que le preocupe, como signos de una reaccin alrgica grave, fiebre muy alta o comportamiento fuera de lo normal. Los signos de una reaccin alrgica grave pueden incluir ronchas, hinchazn de la cara y la garganta, dificultad para respirar, latidos cardacos acelerados, mareos y debilidad. Generalmente, estos comenzaran entre unos pocos minutos y algunas horas despus de la vacunacin. Qu debo hacer? Si usted piensa que se trata de una reaccin alrgica grave o de otra emergencia que no puede esperar, llame al 911 o  dirjase al hospital ms cercano. Sino, llame a su mdico. Despus, la reaccin debe informarse al [id number] de Informacin sobre Efectos Adversos de las [place] (Vaccine Adverse Event Reporting System, VAERS). Su mdico puede presentar este informe, o puede hacerlo usted mismo a travs del sitio web de VAERS, en www.vaers.LAgents.no, o llamando al [phone]. VAERS no brinda recomendaciones mdicas. 6. Cmo puedo obtener ms informacin?  Consulte a su mdico. Este puede darle el prospecto de la vacuna o recomendarle otras fuentes de informacin.  Comunquese con el servicio de salud de su localidad o [address].  Comunquese con los Centros para Air traffic controller y la Prevencin de Child psychotherapist for Disease Control and Prevention , CDC). ? Llame al [phone] (1-800-CDC-INFO), o ? Visite el sitio Environmental manager en [email or web address]. Declaracin de informacin  sobre la vacuna antineumoccica de polisacridos de los CDC (02/25/14) Esta informacin no tiene Theme park manager el consejo del mdico. Asegrese de hacerle al mdico cualquier pregunta que tenga. Document Released: 01/17/2009 Document Revised: 11/11/2014 Document Reviewed: 02/28/2014 Elsevier Interactive Patient Education  2017 Elsevier Inc.  Influenza Virus Vaccine injection (Fluarix) Qu es este medicamento? La VACUNA ANTIGRIPAL ayuda a disminuir el riesgo de contraer la influenza, tambin conocida como la gripe. La vacuna solo ayuda a protegerle contra algunas cepas de influenza. Esta vacuna no ayuda a reducir Nurse, adult de contraer influenza pandmica H1N1. Este medicamento puede ser utilizado para otros usos; si tiene alguna pregunta consulte con su proveedor de atencin mdica o con su farmacutico. MARCAS COMUNES: Fluarix, Fluzone Qu le debo informar a mi profesional de la salud antes de tomar este medicamento? Necesita saber si usted presenta alguno de los siguientes problemas o situaciones: -trastorno de sangrado  como hemofilia -fiebre o infeccin -sndrome de Guillain-Barre u otros problemas neurolgicos -problemas del sistema inmunolgico -infeccin por el virus de la inmunodeficiencia humana (VIH) o SIDA -niveles bajos de plaquetas en la sangre -esclerosis mltiple -una Automotive engineer o inusual a las vacunas antigripales, a los huevos, protenas de pollo, al ltex, a la gentamicina, a otros medicamentos, alimentos, colorantes o conservantes -si est embarazada o buscando quedar embarazada -si est amamantando a un beb Cmo debo utilizar este medicamento? Esta vacuna se administra mediante inyeccin por va intramuscular. Lo administra un profesional de [employer]. Recibir una copia de informacin escrita sobre la vacuna antes de cada vacuna. Asegrese de leer este folleto cada vez cuidadosamente. Este folleto puede cambiar con frecuencia. Hable con su pediatra para informarse acerca del uso de este medicamento en nios. Puede requerir atencin especial. Sobredosis: Pngase en contacto inmediatamente con un centro toxicolgico o una sala de urgencia si usted cree que haya tomado demasiado medicamento. ATENCIN: Reynolds American es solo para usted. No comparta este medicamento con nadie. Qu sucede si me olvido de una dosis? No se aplica en este caso. Qu puede interactuar con este medicamento? -quimioterapia o radioterapia -medicamentos que suprimen el sistema inmunolgico, tales como etanercept, anakinra, infliximab y adalimumab -medicamentos que tratan o previenen cogulos sanguneos, como warfarina -fenitona -medicamentos esteroideos, como la prednisona o la cortisona -teofilina -vacunas Puede ser que esta lista no menciona todas las posibles interacciones. Informe a su profesional de [employer] Homes de [employer] productos a base de hierbas, medicamentos de [place] o suplementos nutritivos que est tomando. Si usted fuma, consume bebidas alcohlicas o si utiliza drogas ilegales, indqueselo  tambin a su profesional de [employer]. Algunas sustancias pueden interactuar con su medicamento. A qu debo estar atento al usar PPL Corporation? Informe a su mdico o a Producer, television/film/video de la Dollar General todos los efectos secundarios que persistan despus de [address] [place] [address]. Llame a su proveedor de atencin mdica si se presentan sntomas inusuales dentro de las 6 semanas posteriores a la vacunacin. Es posible que todava pueda contraer la gripe, pero la enfermedad no ser tan fuerte como normalmente. No puede contraer la gripe de esta vacuna. La vacuna antigripal no le protege contra resfros u otras enfermedades que pueden causar [place]. Debe vacunarse cada ao. Qu efectos secundarios puedo tener al [employer] este medicamento? Efectos secundarios que debe informar a su mdico o a Producer, television/film/video de la salud tan pronto como sea posible: -reacciones alrgicas como erupcin cutnea, picazn o urticarias, hinchazn de la cara, labios o lengua Efectos secundarios que, por lo general, no requieren  atencin mdica (debe informarlos a su mdico o a Producer, television/film/video de la salud si persisten o si son molestos): -fiebre -dolor de cabeza -molestias y dolores musculares -dolor, sensibilidad, enrojecimiento o Paramedic de la inyeccin -cansancio o debilidad Puede ser que esta lista no menciona todos los posibles efectos secundarios. Comunquese a su mdico por asesoramiento mdico [employer]lett[employer]ard. Usted puede informar los efectos secundarios a la FDA por telfono al 1-800-FDA-1088. Dnde debo guardar mi medicina? Esta vacuna se administra solamente en clnicas, farmacias, consultorio mdico u otro consultorio de un profesional de la salud y no Teacher, early years/pre en su domicilio. ATENCIN: Este folleto es un resumen. Puede ser que no cubra toda la posible informacin. Si usted tiene preguntas acerca de esta medicina, consulte con su mdico, su farmacutico o su profesional de Radiographer, therapeutic.  2018  Elsevier/Gold Standard (2010-04-24 15:31:40)

## 2018-08-07 ENCOUNTER — Encounter: Payer: Self-pay | Admitting: Family Medicine

## 2018-08-07 MED ORDER — INSULIN ASPART 100 UNIT/ML FLEXPEN: PEN_INJECTOR | SUBCUTANEOUS | 0 refills | Status: DC

## 2018-08-07 MED ORDER — INSULIN GLARGINE 100 UNIT/ML SOLOSTAR PEN: 30.0000 [IU] | PEN_INJECTOR | Freq: Every day | SUBCUTANEOUS | 0 refills | Status: DC

## 2018-08-07 NOTE — Addendum Note (Signed)
Addended by: Bing Neighbors on: 08/07/2018 05:51 AM   Modules accepted: Orders

## 2018-08-10 ENCOUNTER — Encounter: Payer: Self-pay | Admitting: Family Medicine

## 2018-08-11 NOTE — Telephone Encounter (Signed)
Please contact patient to inquire about his blood sugar readings and schedule a diabetes follow-up for either this week or very early next week.   Joaquin Courts, FNP Primary Care at [facility] [address], [place] [address] [phone]: [phone]

## 2018-08-27 ENCOUNTER — Encounter: Payer: Self-pay | Admitting: Family Medicine

## 2018-08-27 ENCOUNTER — Ambulatory Visit: Payer: BLUE CROSS/BLUE SHIELD | Admitting: Family Medicine

## 2018-08-27 ENCOUNTER — Ambulatory Visit (INDEPENDENT_AMBULATORY_CARE_PROVIDER_SITE_OTHER): Payer: BLUE CROSS/BLUE SHIELD | Admitting: Family Medicine

## 2018-08-27 VITALS — BP 128/81 | HR 93 | Temp 98.5°F | Resp 17 | Ht 65.0 in | Wt 186.0 lb

## 2018-08-27 DIAGNOSIS — E119 Type 2 diabetes mellitus without complications: Secondary | ICD-10-CM

## 2018-08-27 MED ORDER — ONETOUCH VERIO VI STRP: ORAL_STRIP | 4 refills | Status: DC

## 2018-08-27 MED ORDER — INSULIN ASPART 100 UNIT/ML FLEXPEN: PEN_INJECTOR | SUBCUTANEOUS | 6 refills | Status: DC

## 2018-08-27 MED ORDER — METFORMIN HCL 500 MG PO TABS: 500.0000 mg | ORAL_TABLET | Freq: Two times a day (BID) | ORAL | 3 refills | Status: DC

## 2018-08-27 MED ORDER — ONETOUCH DELICA LANCETS 33G MISC: 6 refills | Status: DC

## 2018-08-27 MED ORDER — INSULIN GLARGINE 100 UNIT/ML SOLOSTAR PEN: 30.0000 [IU] | PEN_INJECTOR | Freq: Every day | SUBCUTANEOUS | 6 refills | Status: DC

## 2018-08-27 NOTE — Progress Notes (Signed)
Rodney Fisher, is a 37 y.o. male  PJA:[id number]  HAL:[id number]  DOB - [birth year]  CC:  Chief Complaint  Patient presents with  . Establish Care  . Diabetes       HPI: Rodney Fisher is a 37 y.o. male is here today to establish care.   B and E has Low back pain with left-sided sciatica and Acute pancreatitis on their problem list.   Today's visit: Diabetes Follow-up  [facility] was recently diagnosed with type 2 diabetes. He was last seen in office on 08/04/2018 and his insulin regimen was adjusted. He reports today, blood sugars have been stable for the most part unless he eats something that high in carbs. He is mostly consistent with insulin regimen and is currently on a sliding scale and basal insulin regimen. He has not started metformin as of yet. Last A1C 12.7. Readings per glucometer ranging 130-150's. He has had a few readings 200's. No episodes of hypoglycemia. He denies headaches, chest pain, abdominal pain, nausea, new weakness , numbness or tingling, SOB, or edema.   Current medications: Current Outpatient Medications:  .  B-D UF III MINI PEN NEEDLES 31G X 5 MM MISC, USE UTD WITH INSULIN PENS, Disp: , Rfl: 0 .  blood glucose meter kit and supplies KIT, Dispense based on patient and insurance preference. Use up to four times daily as directed. (FOR ICD-9 250.00, 250.01)., Disp: 1 each, Rfl: 0 .  insulin aspart (NOVOLOG) 100 UNIT/ML FlexPen, Administer per SS into skin 0-8 units  3 times per before meals 125=0 units, 126-180=1 unit, 181-200=2 units,201-250 administer= 4 units, 251-300 administer =6 units, 301 or more 8 units, Disp: 15 mL, Rfl: 0 .  Insulin Glargine (LANTUS) 100 UNIT/ML Solostar Pen, Inject 30 Units into the skin daily., Disp: 15 mL, Rfl: 0 .  metFORMIN (GLUCOPHAGE) 500 MG tablet, Take 1 tablet (500 mg total) by mouth 2 (two) times daily with a meal., Disp: 180 tablet, Rfl: 3 .  ONETOUCH DELICA LANCETS 73Z MISC, USE TO TEST BLOOD SUGAR QID, Disp: , Rfl: 0 .   ONETOUCH VERIO test strip, TEST BLOOD SUGAR UP TO QID UTD, Disp: , Rfl: 0 .  varenicline (CHANTIX CONTINUING MONTH PAK) 1 MG tablet, Take 1 tablet (1 mg total) by mouth 2 (two) times daily., Disp: 30 tablet, Rfl: 2 .  varenicline (CHANTIX STARTING MONTH PAK) 0.5 MG X 11 & 1 MG X 42 tablet, Take one 0.5 mg tablet by mouth once daily for 3 days, then increase to one 0.5 mg tablet twice daily for 4 days, then increase to one 1 mg tablet twice daily., Disp: 53 tablet, Rfl: 0   Pertinent family medical history: family history is not on file.   No Known Allergies  Social History   Socioeconomic History  . Marital status: Single    Spouse name: Not on file  . Number of children: Not on file  . Years of education: Not on file  . Highest education level: Not on file  Occupational History  . Not on file  Social Needs  . Financial resource strain: Not on file  . Food insecurity:    Worry: Not on file    Inability: Not on file  . Transportation needs:    Medical: Not on file    Non-medical: Not on file  Tobacco Use  . Smoking status: Current Every Day Smoker    Packs/day: 0.50  . Smokeless tobacco: Never Used  Substance and Sexual Activity  . Alcohol  use: Not on file  . Drug use: Never  . Sexual activity: Not on file  Lifestyle  . Physical activity:    Days per week: Not on file    Minutes per session: Not on file  . Stress: Not on file  Relationships  . Social connections:    Talks on phone: Not on file    Gets together: Not on file    Attends religious service: Not on file    Active member of club or organization: Not on file    Attends meetings of clubs or organizations: Not on file    Relationship status: Not on file  . Intimate partner violence:    Fear of current or ex partner: Not on file    Emotionally abused: Not on file    Physically abused: Not on file    Forced sexual activity: Not on file  Other Topics Concern  . Not on file  Social History Narrative  . Not on  file    Review of Systems: Pertinent negatives listed in HPI Objective:  There were no vitals filed for this visit.  BP Readings from Last 3 Encounters:  08/04/18 112/70  07/27/18 (!) 148/95  07/18/14 (!) 148/86    Filed Weights   08/27/18 1558  Weight: 186 lb (84.4 kg)      Physical Exam: Constitutional: Patient appears well-developed and well-nourished. No distress. HENT: Normocephalic, atraumatic, External right and left ear normal. Oropharynx is clear and moist.  Eyes: Conjunctivae and EOM are normal. PERRLA, no scleral icterus. Neck: Normal ROM. Neck supple. No JVD. No tracheal deviation. No thyromegaly. CVS: RRR, S1/S2 +, no murmurs, no gallops, no carotid bruit.  Pulmonary: Effort and breath sounds normal, no stridor, rhonchi, wheezes, rales.  Abdominal: Soft. BS +, no distension, tenderness, rebound or guarding.  Musculoskeletal: Normal range of motion. No edema and no tenderness.  Neuro: Alert. Normal muscle tone coordination. Normal gait. BUE and BLE strength 5/5.  Skin: Skin is warm and dry. No rash noted. Not diaphoretic. No erythema. No pallor. Psychiatric: Normal mood and affect. Behavior, judgment, thought content normal. Lab Results (prior encounters)  Lab Results  Component Value Date   WBC 5.1 07/27/2018   HGB 16.0 07/27/2018   HCT 45.3 07/27/2018   MCV 86.8 07/27/2018   PLT 152 07/27/2018   Lab Results  Component Value Date   CREATININE 0.60 (L) 07/27/2018   BUN 5 (L) 07/27/2018   NA 139 07/27/2018   K 3.8 07/27/2018   CL 109 07/27/2018   CO2 25 07/27/2018    Lab Results  Component Value Date   HGBA1C 12.7 (H) 07/26/2018       Component Value Date/Time   CHOL 117 07/26/2018 1051   TRIG 289 (H) 07/26/2018 1051   HDL 24 (L) 07/26/2018 1051   CHOLHDL 4.9 07/26/2018 1051   VLDL 58 (H) 07/26/2018 1051   LDLCALC 35 07/26/2018 1051       Assessment and plan:  1. Type 2 diabetes mellitus without complication, without long-term current use  of insulin ([place]), improving  Continue with current insulin regimen. Adding metformin 500 mg twice daily with food. Continue to monitor blood sugars. Goal is A1C less than 8 today. Will repeat  Hemoglobin A1C and  Comprehensive metabolic panel; Future  Meds ordered this encounter  Medications  . metFORMIN (GLUCOPHAGE) 500 MG tablet    Sig: Take 1 tablet (500 mg total) by mouth 2 (two) times daily with a meal.  Dispense:  180 tablet    Refill:  3  . Insulin Glargine (LANTUS) 100 UNIT/ML Solostar Pen    Sig: Inject 30 Units into the skin daily.    Dispense:  15 mL    Refill:  6    Patient will pick up when needed  . insulin aspart (NOVOLOG) 100 UNIT/ML FlexPen    Sig: Administer per SS into skin 0-8 units  3 times per before meals 125=0 units, 126-180=1 unit, 181-200=2 units,201-250 administer= 4 units, 251-300 administer =6 units, 301 or more 8 units    Dispense:  15 mL    Refill:  6    Patient will pickup when ready  . ONETOUCH VERIO test strip    Sig: TEST BLOOD SUGAR UP TO QID UTD    Dispense:  200 each    Refill:  4  . ONETOUCH DELICA LANCETS 93Z MISC    Sig: USE TO TEST BLOOD SUGAR QID    Dispense:  100 each    Refill:  6   Orders Placed This Encounter  Procedures  . Hemoglobin A1c    Standing Status:   Future    Standing Expiration Date:   08/28/2019  . Comprehensive metabolic panel    Standing Status:   Future    Standing Expiration Date:   08/28/2019    Return in about 4 weeks (around 09/24/2018), or lab appt here at [facility] and 3 months diabetes follow-up.   The patient was given clear instructions to go to ER or return to medical center if symptoms don't improve, worsen or new problems develop. The patient verbalized understanding. The patient was advised  to call and obtain lab results if they haven't heard anything from out office within 7-10 business days.  A total of 20 minutes spent, greater than 50 % of this time was spent counseling and coordination of  care.  Molli Barrows, FNP Primary [facility] at [facility] [address] [place] [address] [phone]: [phone]   This note has been created with Dragon speech recognition software and [employer]er, materials Any transcriptional errors are unintentional.

## 2018-08-27 NOTE — Patient Instructions (Signed)
Contagem de carboidratos no diabetes mellitus, adultos Carbohydrate Counting for Diabetes Mellitus, Adult A contagem de carboidratos  um mtodo para acompanhar a quantidade de carboidratos ingerida por voc. A ingesto de carboidratos aumenta naturalmente a quantidade de acar (glicose) no sangue. A contagem dos carboidratos ingeridos ajuda a Armed forces training and education officer sua glicemia sangunea [employer], o que ajuda a Chief Operating Officer seu diabetes (diabetes mellitus).  importante saber quantos carboidratos voc pode ingerir com segurana em cada refeio. Isso varia de pessoa para pessoa. Um especialista em dieta e nutrio (nutricionista certificado) pode ajudar voc a elaborar um plano de refeies e calcular quantos carboidratos voc deve ingerir em cada refeio e lanche. Carboidratos so encontrados nos seguintes alimentos:  [place], como em pes e cereais.  Feijes secos e produtos da soja.  Verduras com amido, tais como batatas, ervilhas e milho.  Frutas e sucos de frutas.  Leite e iogurte.  Doces e lanches, tais como bolos, biscoitos, batatinhas e refrigerantes.  Como devo fazer para contar carboidratos? H duas maneiras de contar carboidratos nos alimentos. Voc pode usar qualquer [employer] ou uma [employer]. Ler as "informaes nutricionais" dos alimentos embalados A lista de "informaes nutricionais" est includa nos rtulos de quase todos os alimentos e bebidas [employer] EUA e inclui:  O tamanho da poro.  Informaes sobre os nutrientes de cada poro, incluindo os gramas (g) de carboidratos por poro.  Para usar as "informaes nutricionais":  Decida quantas pores voc deseja comer.  Multiplique o nmero de pores pelo nmero de carboidratos por poro.  O nmero resultante  a quantidade total de carboidratos que voc vai ingerir.  Aprender os tamanhos das pores padro de outros alimentos Quando voc ingerir alimentos contendo carboidratos que no  esto embalados ou no incluem as "informaes nutricionais" no rtulo, Scientist, physiological as pores para contar a quantidade de carboidratos:  Determine a [employer] voc vai ingerir usando uma balana ou copo medidor, se necessrio.  Decida quantas pores de tamanho padro voc deseja comer.  Multiplique o nmero de pores por 15. A maioria dos alimentos ricos em carboidratos contm cerca de 15 g de carboidratos por poro. ? Por exemplo: se voc comer 8 oz (170 g) de morangos, ingerir 2 pores e 30 g de carboidratos (2 pores x 15 g = 30 g).  No caso de alimentos com mais de um alimento misturado, como sopas e ensopados, voc precisar contar os carboidratos em cada alimento includo.  A lista a seguir contm tamanhos de pores padro de alimentos comuns ricos em carboidratos. Cada uma dessas pores tem cerca de 15 g de carboidratos:   po de hambrguer ou  muffin ingls.   oz (15 ml) de melado.   oz (14 g) de geleia.  1 fatia de po.  1 tortilha de seis polegadas.  3 oz (85 g) de arroz cozido ou massa.  4 oz (113 g) de feijes secos cozidos.  4 oz (113 g) de verduras ricas em amido, como ervilhas, milho ou batatas.  4 oz (113 g) de cereais quentes.  4 oz (113 g) de pur de batatas ou  de uma batata grande cozida.  4 oz (113 g) de frutas em conserva ou congeladas.  4 oz (120 ml) de suco de fruta.  4-6 bolachas.  6 nuggets de frango.  6 oz (170 g) de cereais secos no adoados.  6 oz (170 g) de iogurte puro sem gordura ou iogurte adoado com adoantes artificiais.  8 oz (240  ml) de leite.  8 oz (170 g) de frutas frescas ou um pedao pequeno de fruta.  24 oz (680 g) de pipoca.  Exemplo de contagem de carboidratos Amostra de refeio  3 oz (85 g) de peito de frango.  6 oz (170 g) de arroz integral.  4 oz (113 g) de milho.  8 oz (240 ml) de leite.  8 oz (170 g) de morangos com creme chantili sem acar. Clculo dos  carboidratos 1. Identifique os alimentos que contm carboidratos: ? Arroz. ? Milho. ? Leite. ? Morangos. 2. Calcule quantas pores voc tem de cada alimento: ? 2 pores de arroz. ? 1 poro de milho. ? 1 poro de leite. ? 1 poro de morangos. 3. Multiplique o nmero de cada poro por 15 g: ? 2 pores de arroz x 15 g = 30 g. ? 1 poro de milho x 15 g = 15 g. ? 1 poro de leite x 15 g = 15 g. ? 1 poro de morangos x 15 g = 15 g. 4. Adicione todas as quantidades para encontrar o total de carboidratos ingeridos em gramas: ? 30 g + 15 g + 15 g + 15 g = 75 g de carboidratos no total. Estas informaes no se destinam a substituir as recomendaes de seu mdico. No deixe de discutir quaisquer dvidas com seu mdico. Document Released: 02/12/2016 Document Revised: 10/08/2016 Document Reviewed: 04/03/2016 Elsevier Interactive Patient Education  2018 [employer].

## 2018-09-21 ENCOUNTER — Encounter: Payer: Self-pay | Admitting: Family Medicine

## 2018-09-25 ENCOUNTER — Ambulatory Visit (INDEPENDENT_AMBULATORY_CARE_PROVIDER_SITE_OTHER): Payer: BLUE CROSS/BLUE SHIELD

## 2018-09-25 DIAGNOSIS — E119 Type 2 diabetes mellitus without complications: Secondary | ICD-10-CM

## 2018-09-26 LAB — SPECIMEN STATUS REPORT

## 2018-09-26 LAB — COMPREHENSIVE METABOLIC PANEL
ALBUMIN: 4.4 g/dL (ref 3.5–5.5)
ALK PHOS: 64 IU/L (ref 39–117)
ALT: 22 IU/L (ref 0–44)
AST: 18 IU/L (ref 0–40)
Albumin/Globulin Ratio: 2.1 (ref 1.2–2.2)
BILIRUBIN TOTAL: 0.7 mg/dL (ref 0.0–1.2)
BUN / CREAT RATIO: 16 (ref 9–20)
BUN: 12 mg/dL (ref 6–20)
CHLORIDE: 99 mmol/L (ref 96–106)
CO2: 24 mmol/L (ref 20–29)
CREATININE: 0.75 mg/dL — AB (ref 0.76–1.27)
Calcium: 9.5 mg/dL (ref 8.7–10.2)
GFR calc Af Amer: 135 mL/min/{1.73_m2} (ref >59)
GFR calc non Af Amer: 117 mL/min/{1.73_m2} (ref >59)
GLOBULIN, TOTAL: 2.1 g/dL (ref 1.5–4.5)
Glucose: 147 mg/dL — ABNORMAL HIGH (ref 65–99)
Potassium: 4.5 mmol/L (ref 3.5–5.2)
SODIUM: 140 mmol/L (ref 134–144)
Total Protein: 6.5 g/dL (ref 6.0–8.5)

## 2018-09-26 LAB — HEMOGLOBIN A1C
ESTIMATED AVERAGE GLUCOSE: 180 mg/dL
HEMOGLOBIN A1C: 7.9 % — AB (ref 4.8–5.6)

## 2018-09-30 ENCOUNTER — Telehealth: Payer: Self-pay | Admitting: Family Medicine

## 2018-09-30 MED ORDER — INSULIN GLARGINE 100 UNIT/ML SOLOSTAR PEN: 20.0000 [IU] | PEN_INJECTOR | Freq: Every day | SUBCUTANEOUS | 6 refills | Status: DC

## 2018-09-30 MED ORDER — METFORMIN HCL 1000 MG PO TABS: 1000.0000 mg | ORAL_TABLET | Freq: Two times a day (BID) | ORAL | 1 refills | Status: DC

## 2018-09-30 NOTE — Telephone Encounter (Signed)
Erroneous

## 2018-09-30 NOTE — Telephone Encounter (Signed)
Patient notified of lab results & recommendations. He repeated changes back correctly. Scheduled follow up appt on 11/11/18.

## 2018-09-30 NOTE — Telephone Encounter (Signed)
Contact patient to advise his A1c has greatly improved it is currently 7.9.   He will need to DC the NovoLog short acting sliding scale insulin as he will no longer need this regimen.  He will need to remain on the once daily Lantus however I am decreasing the dose to 20 units once daily.  I am increasing his metformin to thousand milligrams once in the morning and once at bedtime.  He can continue taking the tablets that he has at home he will just need to take 2 in the morning and 2 at night equal to 1000 mg dose.  Since I am making a change to his medication regimen I would like to see him back in 6 weeks and we will recheck an A1c at that time.   Dated dosages and prescriptions have been sent to the pharmacy.

## 2018-09-30 NOTE — Telephone Encounter (Signed)
Left voice mail to call back 

## 2018-11-03 ENCOUNTER — Encounter: Payer: Self-pay | Admitting: Family Medicine

## 2018-11-04 MED ORDER — BD PEN NEEDLE MINI U/F 31G X 5 MM MISC: 5 refills | Status: DC

## 2018-11-04 NOTE — Telephone Encounter (Signed)
Refilled pens.

## 2018-11-11 ENCOUNTER — Encounter: Payer: Self-pay | Admitting: Family Medicine

## 2018-11-11 ENCOUNTER — Ambulatory Visit (INDEPENDENT_AMBULATORY_CARE_PROVIDER_SITE_OTHER): Payer: Self-pay | Admitting: Family Medicine

## 2018-11-11 VITALS — BP 126/88 | HR 77 | Resp 17 | Ht 65.0 in | Wt 189.8 lb

## 2018-11-11 DIAGNOSIS — E119 Type 2 diabetes mellitus without complications: Secondary | ICD-10-CM

## 2018-11-11 DIAGNOSIS — E1165 Type 2 diabetes mellitus with hyperglycemia: Secondary | ICD-10-CM

## 2018-11-11 LAB — GLUCOSE, POCT (MANUAL RESULT ENTRY): POC GLUCOSE: 286 mg/dL — AB (ref 70–99)

## 2018-11-11 MED ORDER — ONETOUCH DELICA LANCETS 33G MISC: 6 refills | Status: DC

## 2018-11-11 MED ORDER — METFORMIN HCL 1000 MG PO TABS: 1000.0000 mg | ORAL_TABLET | Freq: Every evening | ORAL | 3 refills | Status: DC

## 2018-11-11 MED ORDER — ONETOUCH VERIO VI STRP: ORAL_STRIP | 4 refills | Status: DC

## 2018-11-11 MED ORDER — BD PEN NEEDLE MINI U/F 31G X 5 MM MISC: 5 refills | Status: DC

## 2018-11-11 NOTE — Patient Instructions (Addendum)
Resume Lantus 20 units daily. Metformin take 1000 mg in the evening. I'll see you back in 3 months!     Diabetes Mellitus and Nutrition, Adult When you have diabetes (diabetes mellitus), it is very important to have healthy eating habits because your blood sugar (glucose) levels are greatly affected by what you eat and drink. Eating healthy foods in the appropriate amounts, at about the same times every day, can help you:  Control your blood glucose.  Lower your risk of heart disease.  Improve your blood pressure.  Reach or maintain a healthy weight. Every person with diabetes is different, and each person has different needs for a meal plan. Your health care provider may recommend that you work with a diet and nutrition specialist (dietitian) to make a meal plan that is best for you. Your meal plan may vary depending on factors such as:  The calories you need.  The medicines you take.  Your weight.  Your blood glucose, blood pressure, and cholesterol levels.  Your activity level.  Other health conditions you have, such as heart or kidney disease. How do carbohydrates affect me? Carbohydrates, also called carbs, affect your blood glucose level more than any other type of food. Eating carbs naturally raises the amount of glucose in your blood. Carb counting is a method for keeping track of how many carbs you eat. Counting carbs is important to keep your blood glucose at a healthy level, especially if you use insulin or take certain oral diabetes medicines. It is important to know how many carbs you can safely have in each meal. This is different for every person. Your dietitian can help you calculate how many carbs you should have at each meal and for each snack. Foods that contain carbs include:  Bread, cereal, rice, pasta, and crackers.  Potatoes and corn.  Peas, beans, and lentils.  Milk and yogurt.  Fruit and juice.  Desserts, such as cakes, cookies, ice cream, and  candy. How does alcohol affect me? Alcohol can cause a sudden decrease in blood glucose (hypoglycemia), especially if you use insulin or take certain oral diabetes medicines. Hypoglycemia can be a life-threatening condition. Symptoms of hypoglycemia (sleepiness, dizziness, and confusion) are similar to symptoms of having too much alcohol. If your health care provider says that alcohol is safe for you, follow these guidelines:  Limit alcohol intake to no more than 1 drink per day for nonpregnant women and 2 drinks per day for men. One drink equals 12 oz of beer, 5 oz of wine, or 1 oz of hard liquor.  Do not drink on an empty stomach.  Keep yourself hydrated with water, diet soda, or unsweetened iced tea.  Keep in mind that regular soda, juice, and other mixers may contain a lot of sugar and must be counted as carbs. What are tips for following this plan?  Reading food labels  Start by checking the serving size on the "Nutrition Facts" label of packaged foods and drinks. The amount of calories, carbs, fats, and other nutrients listed on the label is based on one serving of the item. Many items contain more than one serving per package.  Check the total grams (g) of carbs in one serving. You can calculate the number of servings of carbs in one serving by dividing the total carbs by 15. For example, if a food has 30 g of total carbs, it would be equal to 2 servings of carbs.  Check the number of grams (g) of  saturated and trans fats in one serving. Choose foods that have low or no amount of these fats.  Check the number of milligrams (mg) of salt (sodium) in one serving. Most people should limit total sodium intake to less than 2,300 mg per day.  Always check the nutrition information of foods labeled as "low-fat" or "nonfat". These foods may be higher in added sugar or refined carbs and should be avoided.  Talk to your dietitian to identify your daily goals for nutrients listed on the  label. Shopping  Avoid buying canned, premade, or processed foods. These foods tend to be high in fat, sodium, and added sugar.  Shop around the outside edge of the grocery store. This includes fresh fruits and vegetables, bulk grains, fresh meats, and fresh dairy. Cooking  Use low-heat cooking methods, such as baking, instead of high-heat cooking methods like deep frying.  Cook using healthy oils, such as olive, canola, or sunflower oil.  Avoid cooking with butter, cream, or high-fat meats. Meal planning  Eat meals and snacks regularly, preferably at the same times every day. Avoid going long periods of time without eating.  Eat foods high in fiber, such as fresh fruits, vegetables, beans, and whole grains. Talk to your dietitian about how many servings of carbs you can eat at each meal.  Eat 4-6 ounces (oz) of lean protein each day, such as lean meat, chicken, fish, eggs, or tofu. One oz of lean protein is equal to: ? 1 oz of meat, chicken, or fish. ? 1 egg. ?  cup of tofu.  Eat some foods each day that contain healthy fats, such as avocado, nuts, seeds, and fish. Lifestyle  Check your blood glucose regularly.  Exercise regularly as told by your health care provider. This may include: ? 150 minutes of moderate-intensity or vigorous-intensity exercise each week. This could be brisk walking, biking, or water aerobics. ? Stretching and doing strength exercises, such as yoga or weightlifting, at least 2 times a week.  Take medicines as told by your health care provider.  Do not use any products that contain nicotine or tobacco, such as cigarettes and e-cigarettes. If you need help quitting, ask your health care provider.  Work with a Social worker or diabetes educator to identify strategies to manage stress and any emotional and social challenges. Questions to ask a health care provider  Do I need to meet with a diabetes educator?  Do I need to meet with a dietitian?  What  number can I call if I have questions?  When are the best times to check my blood glucose? Where to find more information:  American Diabetes Association: diabetes.org  Academy of Nutrition and Dietetics: www.eatright.[employer] of Diabetes and Digestive and Kidney Diseases (NIH): [employer] Summary  A healthy meal plan will help you control your blood glucose and maintain a healthy lifestyle.  Working with a diet and nutrition specialist (dietitian) can help you make a meal plan that is best for you.  Keep in mind that carbohydrates (carbs) and alcohol have immediate effects on your blood glucose levels. It is important to count carbs and to use alcohol carefully. This information is not intended to replace advice given to you by your health care provider. Make sure you discuss any questions you have with your health care provider. Document Released: 07/18/2005 Document Revised: 05/21/2017 Document Reviewed: 11/25/2016 Elsevier Interactive Patient Education  2019 [employer].

## 2018-11-11 NOTE — Progress Notes (Signed)
Established Patient Office Visit  Subjective:  Patient ID: Rodney Fisher, male    DOB: 11-15-1980  Age: 38 y.o. MRN: [id number]  CC:  Chief Complaint  Patient presents with  . Diabetes    has not been taking insulin b/c he has been out of his pen needles   HPI Rodney Fisher presents for diabetes follow-up. He is currently been without Lantus since before christmas. Reports that his readings has improved and he discontinued medication. He noticed within the last week glucose readings increased beyond 200 and he picked up Lantus but hasn't resumed medication. Takes metformin inconsistently due to work and GI side effects of medications. Readings last month were well controlled prior to the holidays ranging from 120-130 fasting. He denies polyuria, polyphagia, polydipsia, shortness of breath, visual disturbances, or chest pain.  No past medical history on file.  Past Surgical History:  Procedure Laterality Date  . ELBOW SURGERY  06/14/2014    No family history on file.  Social History   Socioeconomic History  . Marital status: Single    Spouse name: Not on file  . Number of children: Not on file  . Years of education: Not on file  . Highest education level: Not on file  Occupational History  . Not on file  Social Needs  . Financial resource strain: Not on file  . Food insecurity:    Worry: Not on file    Inability: Not on file  . Transportation needs:    Medical: Not on file    Non-medical: Not on file  Tobacco Use  . Smoking status: Current Every Day Smoker    Packs/day: 0.50  . Smokeless tobacco: Never Used  Substance and Sexual Activity  . Alcohol use: Not on file  . Drug use: Never  . Sexual activity: Not on file  Lifestyle  . Physical activity:    Days per week: Not on file    Minutes per session: Not on file  . Stress: Not on file  Relationships  . Social connections:    Talks on phone: Not on file    Gets together: Not on file    Attends religious  service: Not on file    Active member of club or organization: Not on file    Attends meetings of clubs or organizations: Not on file    Relationship status: Not on file  . Intimate partner violence:    Fear of current or ex partner: Not on file    Emotionally abused: Not on file    Physically abused: Not on file    Forced sexual activity: Not on file  Other Topics Concern  . Not on file  Social History Narrative  . Not on file    Outpatient Medications Prior to Visit  Medication Sig Dispense Refill  . blood glucose meter kit and supplies KIT Dispense based on patient and insurance preference. Use up to four times daily as directed. (FOR ICD-9 250.00, 250.01). 1 each 0  . metFORMIN (GLUCOPHAGE) 1000 MG tablet Take 1 tablet (1,000 mg total) by mouth 2 (two) times daily with a meal. 180 tablet 1  . varenicline (CHANTIX CONTINUING MONTH PAK) 1 MG tablet Take 1 tablet (1 mg total) by mouth 2 (two) times daily. 30 tablet 2  . ONETOUCH DELICA LANCETS 48G MISC USE TO TEST BLOOD SUGAR QID 100 each 6  . ONETOUCH VERIO test strip TEST BLOOD SUGAR UP TO QID UTD 200 each 4  . Insulin  Glargine (LANTUS) 100 UNIT/ML Solostar Pen Inject 20 Units into the skin daily. (Patient not taking: Reported on 11/11/2018) 15 mL 6  . B-D UF III MINI PEN NEEDLES 31G X 5 MM MISC USE UTD WITH INSULIN PENS (Patient not taking: Reported on 11/11/2018) 100 each 5  . varenicline (CHANTIX STARTING MONTH PAK) 0.5 MG X 11 & 1 MG X 42 tablet Take one 0.5 mg tablet by mouth once daily for 3 days, then increase to one 0.5 mg tablet twice daily for 4 days, then increase to one 1 mg tablet twice daily. 53 tablet 0   No facility-administered medications prior to visit.     No Known Allergies  ROS Review of Systems Pertinent negatives listed in HPI   Objective:    Physical Exam  BP 126/88   Pulse 77   Resp 17   Ht '5\' 5"'  (1.651 m)   Wt 189 lb 12.8 oz (86.1 kg)   SpO2 98%   BMI 31.58 kg/m  Wt Readings from Last 3  Encounters:  11/11/18 189 lb 12.8 oz (86.1 kg)  08/27/18 186 lb (84.4 kg)  08/04/18 180 lb (81.6 kg)       Health Maintenance Due  Topic Date Due  . URINE MICROALBUMIN  02/28/1991  . HIV Screening  02/28/1996  . TETANUS/TDAP  02/28/2000    There are no preventive care reminders to display for this patient.  No results found for: TSH Lab Results  Component Value Date   WBC 5.1 07/27/2018   HGB 16.0 07/27/2018   HCT 45.3 07/27/2018   MCV 86.8 07/27/2018   PLT 152 07/27/2018   Lab Results  Component Value Date   NA 140 09/25/2018   K 4.5 09/25/2018   CO2 24 09/25/2018   GLUCOSE 147 (H) 09/25/2018   BUN 12 09/25/2018   CREATININE 0.75 (L) 09/25/2018   BILITOT 0.7 09/25/2018   ALKPHOS 64 09/25/2018   AST 18 09/25/2018   ALT 22 09/25/2018   PROT 6.5 09/25/2018   ALBUMIN 4.4 09/25/2018   CALCIUM 9.5 09/25/2018   ANIONGAP 5 07/27/2018   Lab Results  Component Value Date   CHOL 117 07/26/2018   Lab Results  Component Value Date   HDL 24 (L) 07/26/2018   Lab Results  Component Value Date   LDLCALC 35 07/26/2018   Lab Results  Component Value Date   TRIG 289 (H) 07/26/2018   Lab Results  Component Value Date   CHOLHDL 4.9 07/26/2018   Lab Results  Component Value Date   HGBA1C 7.9 (H) 09/25/2018      Assessment & Plan:  1. Type 2 diabetes mellitus without complication, without long-term current use of insulin ([place]), not well controlled. Most recent A1C 7.9 which was improved since starting diabetes medication.  Recently stopped Lantus and resumed poor eating habits. Glucose elevated today. Reinforced with education the importance of adherence to medication and appropriate food choices. Metformin is causing GI problems. Will reduce dosage frequency to Metformin 1000 mg once daily at night. 3 months recheck A1C and lipid panel Checking:Microalbumin/Creatinine Ratio, Urine  Meds ordered this encounter  Medications  . B-D UF III MINI PEN NEEDLES 31G X  5 MM MISC    Sig: USE UTD WITH INSULIN PENS    Dispense:  100 each    Refill:  5  . ONETOUCH DELICA LANCETS 45G MISC    Sig: USE TO TEST BLOOD SUGAR QID    Dispense:  100 each  Refill:  6  . ONETOUCH VERIO test strip    Sig: TEST BLOOD SUGAR UP TO QID UTD    Dispense:  200 each    Refill:  4    Follow-up: Return in about 3 months (around 02/10/2019) for diabetes and lipid panel .    Molli Barrows, FNP

## 2018-11-13 LAB — MICROALBUMIN / CREATININE URINE RATIO
Creatinine, Urine: 63.7 mg/dL
MICROALB/CREAT RATIO: 25 mg/g{creat} (ref 0.0–30.0)
Microalbumin, Urine: 15.9 ug/mL

## 2019-02-10 ENCOUNTER — Ambulatory Visit (INDEPENDENT_AMBULATORY_CARE_PROVIDER_SITE_OTHER): Payer: Self-pay | Admitting: Family Medicine

## 2019-02-10 ENCOUNTER — Other Ambulatory Visit: Payer: Self-pay

## 2019-02-10 DIAGNOSIS — E119 Type 2 diabetes mellitus without complications: Secondary | ICD-10-CM

## 2019-02-10 DIAGNOSIS — F17200 Nicotine dependence, unspecified, uncomplicated: Secondary | ICD-10-CM

## 2019-02-10 NOTE — Progress Notes (Signed)
Worked up patient for his televisit with Jerrilyn Cairo, FNP. States that he's restarted the Lantus insulin at 15 units at night. FSBS have been running between 110-130.

## 2019-02-10 NOTE — Progress Notes (Signed)
Patient ID: Rodney Fisher, male    DOB: August 27, 1981, 38 y.o.   MRN: [id number]  PCP: Bing NeighborsHarris, Shafter Jupin S, FNP  Chief Complaint  Patient presents with  . Diabetes    Subjective:  HPI  Rodney Fisher Space is a 38 y.o. male consents to telephonic encounter.  [name] is at home during today's encounter. Provider is located at primary care office during today's encounter.  [facility], current smoker, monitors glucose at home. Last A1C 9.0. He reports about 2-3 weeks ago, he had to resume his insulin as his blood sugars started to increase upper 100 and low 200's.  Today his blood sugar is 110 , fasting. He is also taking metformin 500 mg twice daily. Since resuming insulin, his blood sugars have ranged between 110-140's. He denies any readings less than 100. Denies neuropathic pain, chest pain, or shortness of breath. He is making efforts to try and stop smoking. He doesn't need refills today.   has Low back pain with left-sided sciatica; Acute pancreatitis; and Type 2 diabetes mellitus without complication, without long-term current use of insulin (HCC) on their problem list.   Today's visit:  Social History   Socioeconomic History  . Marital status: Single    Spouse name: Not on file  . Number of children: Not on file  . Years of education: Not on file  . Highest education level: Not on file  Occupational History  . Not on file  Social Needs  . Financial resource strain: Not on file  . Food insecurity:    Worry: Not on file    Inability: Not on file  . Transportation needs:    Medical: Not on file    Non-medical: Not on file  Tobacco Use  . Smoking status: Current Every Day Smoker    Packs/day: 0.50  . Smokeless tobacco: Never Used  Substance and Sexual Activity  . Alcohol use: Not on file  . Drug use: Never  . Sexual activity: Not on file  Lifestyle  . Physical activity:    Days per week: Not on file    Minutes per session: Not on file  . Stress: Not on  file  Relationships  . Social connections:    Talks on phone: Not on file    Gets together: Not on file    Attends religious service: Not on file    Active member of club or organization: Not on file    Attends meetings of clubs or organizations: Not on file    Relationship status: Not on file  . Intimate partner violence:    Fear of current or ex partner: Not on file    Emotionally abused: Not on file    Physically abused: Not on file    Forced sexual activity: Not on file  Other Topics Concern  . Not on file  Social History Narrative  . Not on file   Review of Systems Pertinent negatives listed in HPI No Known Allergies  Prior to Admission medications   Medication Sig Start Date End Date Taking? Authorizing Provider  B-D UF III MINI PEN NEEDLES 31G X 5 MM MISC USE UTD WITH INSULIN PENS 11/11/18  Yes Bing NeighborsHarris, Veona Bittman S, FNP  Insulin Glargine (LANTUS) 100 UNIT/ML Solostar Pen Inject 20 Units into the skin daily. Patient taking differently: Inject 15 Units into the skin daily.  09/30/18  Yes Bing NeighborsHarris, Gorje Iyer S, FNP  metFORMIN (GLUCOPHAGE) 500 MG tablet TK 1 T PO BID WITH A MEAL  01/27/19  Yes [provider]  Dola Argyle LANCETS 33G MISC USE TO TEST BLOOD SUGAR QID 11/11/18  Yes Bing Neighbors, FNP  [facility] VERIO test strip TEST BLOOD SUGAR UP TO QID UTD 11/11/18  Yes Bing Neighbors, FNP    Past Medical, Surgical Family and Social History reviewed and updated.     Assessment & Plan:  1. Type 2 diabetes mellitus without complication, without long-term current use of insulin (HCC) -Continue Lantus at 15 units at nighttime -Continue metformin 500 mg twice daily -Continue monitoring blood sugar if hypoglycemia develops prior to next follow-up recommend reducing Lantus to 10 units -Recommend routine physical activity reemphasized the importance of proper diet control.  2. Current smoker -Cessation recommended   RTC: 6 weeks to recheck A1C, needs PP23, foot  exam, and overdue for diabetic eye exam   A total of 15 minutes spent obtaining history, obtaining home readings of blood sugar, reviewing medication and medication doses, recommending health promoting activity such as smoking cessation. Provided education.       Rodney Courts, FNP Primary Care at [facility] [address] [place] [address] [phone]: [phone]

## 2019-03-19 ENCOUNTER — Ambulatory Visit: Payer: Self-pay | Admitting: Family Medicine

## 2019-03-24 ENCOUNTER — Encounter: Payer: Self-pay | Admitting: Family Medicine

## 2019-03-24 ENCOUNTER — Ambulatory Visit (INDEPENDENT_AMBULATORY_CARE_PROVIDER_SITE_OTHER): Payer: BLUE CROSS/BLUE SHIELD | Admitting: Family Medicine

## 2019-03-24 ENCOUNTER — Other Ambulatory Visit: Payer: Self-pay

## 2019-03-24 VITALS — BP 136/78 | HR 78 | Temp 98.2°F | Resp 17 | Ht 65.0 in | Wt 189.8 lb

## 2019-03-24 DIAGNOSIS — Z23 Encounter for immunization: Secondary | ICD-10-CM

## 2019-03-24 DIAGNOSIS — E119 Type 2 diabetes mellitus without complications: Secondary | ICD-10-CM

## 2019-03-24 DIAGNOSIS — Z1389 Encounter for screening for other disorder: Secondary | ICD-10-CM | POA: Diagnosis not present

## 2019-03-24 DIAGNOSIS — M5442 Lumbago with sciatica, left side: Secondary | ICD-10-CM | POA: Diagnosis not present

## 2019-03-24 DIAGNOSIS — Z0100 Encounter for examination of eyes and vision without abnormal findings: Secondary | ICD-10-CM

## 2019-03-24 LAB — POCT URINALYSIS DIP (CLINITEK)
Bilirubin, UA: NEGATIVE
Blood, UA: NEGATIVE
Glucose, UA: 500 mg/dL — AB
Ketones, POC UA: NEGATIVE mg/dL
Leukocytes, UA: NEGATIVE
Nitrite, UA: NEGATIVE
POC PROTEIN,UA: NEGATIVE
Spec Grav, UA: 1.015 (ref 1.010–1.025)
Urobilinogen, UA: 0.2 E.U./dL
pH, UA: 6 (ref 5.0–8.0)

## 2019-03-24 NOTE — Progress Notes (Signed)
Patient ID: Rodney Fisher, male    DOB: 28-Nov-1980, 38 y.o.   MRN: [id number]  PCP: Bing Neighbors, FNP  Chief Complaint  Patient presents with  . Diabetes    Subjective:  HPI  Rodney Fisher is a 38 y.o. male presents for evaluation   has Low back pain with left-sided sciatica; Acute pancreatitis; and Type 2 diabetes mellitus without complication, without long-term current use of insulin (HCC) on their problem list.   Last visit Rodney Fisher, current smoker, monitors glucose at home. Last A1C 7.9. He reports about 2-3 weeks ago, he had to resume his insulin (lantus) as his blood sugars started to increase upper 100 and low 200's.  Today his blood sugar is 110 , fasting. He is also taking metformin 500 mg twice daily. Since resuming insulin, his blood sugars have ranged between 110-140's. He denies any readings less than 100. Denies neuropathic pain, chest pain, or shortness of breath. He is making efforts to try and stop smoking. He doesn't need refills today.  Social History   Socioeconomic History  . Marital status: Single    Spouse name: Not on file  . Number of children: Not on file  . Years of education: Not on file  . Highest education level: Not on file  Occupational History  . Not on file  Social Needs  . Financial resource strain: Not on file  . Food insecurity:    Worry: Not on file    Inability: Not on file  . Transportation needs:    Medical: Not on file    Non-medical: Not on file  Tobacco Use  . Smoking status: Current Every Day Smoker    Packs/day: 0.50  . Smokeless tobacco: Never Used  Substance and Sexual Activity  . Alcohol use: Not on file  . Drug use: Never  . Sexual activity: Not on file  Lifestyle  . Physical activity:    Days per week: Not on file    Minutes per session: Not on file  . Stress: Not on file  Relationships  . Social connections:    Talks on phone: Not on file    Gets together: Not on file    Attends religious service: Not on  file    Active member of club or organization: Not on file    Attends meetings of clubs or organizations: Not on file    Relationship status: Not on file  . Intimate partner violence:    Fear of current or ex partner: Not on file    Emotionally abused: Not on file    Physically abused: Not on file    Forced sexual activity: Not on file  Other Topics Concern  . Not on file  Social History Narrative  . Not on file    No family history on file.   Review of Systems Pertinent negatives listed in HPI No Known Allergies  Prior to Admission medications   Medication Sig Start Date End Date Taking? Authorizing Provider  B-D UF III MINI PEN NEEDLES 31G X 5 MM MISC USE UTD WITH INSULIN PENS 11/11/18  Yes Bing Neighbors, FNP  metFORMIN (GLUCOPHAGE) 500 MG tablet TK 1 T PO BID WITH A MEAL 01/27/19  Yes [provider]  ONETOUCH DELICA LANCETS 33G MISC USE TO TEST BLOOD SUGAR QID 11/11/18  Yes Bing Neighbors, FNP  [facility] VERIO test strip TEST BLOOD SUGAR UP TO QID UTD 11/11/18  Yes Bing Neighbors, FNP  Insulin  Glargine (LANTUS) 100 UNIT/ML Solostar Pen Inject 20 Units into the skin daily. Patient not taking: Reported on 03/24/2019 09/30/18   Bing NeighborsHarris, Ojas Coone S, FNP    Past Medical, Surgical Family and Social History reviewed and updated.    Objective:   Today's Vitals   03/24/19 1043  BP: 136/78  Pulse: 78  Resp: 17  Temp: 98.2 F (36.8 Fisher)  TempSrc: Temporal  SpO2: 99%  Weight: 189 lb 12.8 oz (86.1 kg)  Height: 5\' 5"  (1.651 m)    BP Readings from Last 3 Encounters:  03/24/19 136/78  11/11/18 126/88  08/27/18 128/81    Filed Weights   03/24/19 1043  Weight: 189 lb 12.8 oz (86.1 kg)       Physical Exam General appearance: alert, well developed, well nourished, cooperative and in no distress Head: Normocephalic, without obvious abnormality, atraumatic Respiratory: Respirations even and unlabored, normal respiratory rate Heart: rate and rhythm normal. No  gallop or murmurs noted on exam  Extremities: No gross deformities Skin: Skin color, texture, turgor normal. No rashes seen  Psych: Appropriate mood and affect. Neurologic: Mental status: Alert, oriented to person, place, and time, thought content appropriate. Lab Results  Component Value Date   POCGLU 286 (A) 11/11/2018   POCGLU 107 (A) 08/04/2018    Lab Results  Component Value Date   HGBA1C 7.9 (H) 09/25/2018       Assessment & Plan:  1. Type 2 diabetes mellitus without complication, without long-term current use of insulin (HCC) Home readings not at goal of less than 125. Lantus is administered inconsistently.  Repeating Hemoglobin A1c-if not at goal, will recommend resuming Lantus daily. Continue metformin 1000 mg BID. - Lipid panel (no current statin therapy) - POCT URINALYSIS DIP (CLINITEK) - HM Diabetes Foot Exam, competed -Referral placed for diabetic eye exam - Pneumococcal polysaccharide vaccine 23-valent greater than or equal to 2yo subcutaneous/IM - Hemoglobin A1c  2. Need for pneumococcal vaccine - Pneumococcal polysaccharide vaccine 23-valent greater than or equal to 2yo subcutaneous/IM  3. Need for diphtheria-tetanus-pertussis (Tdap) vaccine - Tdap vaccine greater than or equal to 7yo IM  4. Diabetic eye exam [facility](HCC) - Ambulatory referral to Ophthalmology   Orders Placed This Encounter  Procedures  . Pneumococcal polysaccharide vaccine 23-valent greater than or equal to 2yo subcutaneous/IM  . Tdap vaccine greater than or equal to 7yo IM  . Hemoglobin A1c    Standing Status:   Future    Number of Occurrences:   1    Standing Expiration Date:   03/23/2020  . Lipid panel    Order Specific Question:   Has the patient fasted?    Answer:   No  . Ambulatory referral to Ophthalmology    Referral Priority:   Routine    Referral Type:   Consultation    Referral Reason:   Specialty Services Required    Referred to Provider:   Rennis ChrisZamora, Brian, MD    Requested  Specialty:   Ophthalmology    Number of Visits Requested:   1  . POCT URINALYSIS DIP (CLINITEK)  . HM Diabetes Foot Exam    Follow-up based on labs.   Rodney CourtsKimberly Kennth Vanbenschoten, FNP-[facility] [address], [place] [id number] [phone]: [phone]

## 2019-03-24 NOTE — Patient Instructions (Signed)
Pneumococcal Vaccine, Polyvalent solution for injection Qu es este medicamento? La [employer] ANTINEUMOCCICA POLIVALENTE es una vacuna que ayuda a prevenir la infeccin causada por cepas de neumococos. Estas bacterias son la causa principal de las infecciones del odo, infecciones de la garganta por estreptococos y neumona, meningitis o infecciones sanguneas graves en todo el mundo. Estas vacunas ayudan al cuerpo a producir anticuerpos (sustancias protectoras) que le ayudan a combatir estas bacterias. La vacuna se recomienda para las personas de 2 aos de edad o mayor con problemas de [employer]. Se recomienda tambin para todos los [address] de 50 aos de [place]. Esta vacuna no sirve para tratar a una infeccin. Este medicamento puede ser utilizado para otros usos; si tiene alguna pregunta consulte con su proveedor de atencin mdica o con su farmacutico. MARCAS COMUNES: Pneumovax 23 Qu le debo informar a mi profesional de la salud antes de tomar este medicamento? Necesita saber si usted presenta alguno de los [employer] [facility] o situaciones: -problemas de sangrado -transplante de mdula sea u de rganos -cncer, enfermedad de [place]kin -fiebre -infeccin -problemas del sistema inmunolgico -baja cantidad de plaquetas en la sangre -convulsiones -una reaccin alrgica o inusual a la vacuna antineumoccica, a la difteria toxoid, a otras vacunas, al ltex, a otros medicamentos, alimentos, colorantes o conservantes -si est embarazada o buscando quedar embarazada -si est amamantando a un beb Cmo debo utilizar este medicamento? Esta vacuna se administra mediante inyeccin por va intramuscular o subcutnea. Lo administra un profesional de [employer]. Recibir una copia de informacin escrita sobre la vacuna antes de cada vacuna. Asegrese de leer este folleto cada vez cuidadosamente. Este folleto puede cambiar con frecuencia. Hable con su pediatra para informarse acerca del uso de este  medicamento en nios. Aunque este medicamento ha sido recetado a nios tan menores como de 2 aos de edad para condiciones selectivas, las precauciones se aplican. Sobredosis: Pngase en contacto inmediatamente con un centro toxicolgico o una sala de urgencia si usted cree que haya tomado demasiado medicamento. ATENCIN: [employer] es solo para usted. No comparta este medicamento con nadie. Qu sucede si me olvido de una dosis? Es importante no olvidar ninguna dosis. Informe a su mdico o a su profesional de la salud si no puede asistir a Marketing executive. Qu puede interactuar con este medicamento? -medicamentos para la quimioterapia contra el cncer -medicamentos que suprimen su funcin inmunolgica -medicamentos que tratan o previenen cogulos sanguneos, tales como warfarina, enoxaparina y dalteparina -medicamentos esteroideos, como la prednisona o la cortisona Puede ser que esta lista no menciona todas las posibles interacciones. Informe a su profesional de [employer] de [employer] productos a base de hierbas, medicamentos de [place] o suplementos nutritivos que est tomando. Si usted fuma, consume bebidas alcohlicas o si utiliza drogas ilegales, indqueselo tambin a su profesional de [employer]. Algunas sustancias pueden interactuar con su medicamento. A qu debo estar atento al usar [employer]? La fiebre leve y Chief Technology Officer deben desaparecer despus de 3 das o menos. Informe a su mdico o su profesional de la salud si se presentan sntomas inusuales. Qu efectos secundarios puedo tener al [employer] este medicamento? Efectos secundarios que debe informar a su mdico o a Producer, television/film/video de la salud tan pronto como sea posible: -Therapist, art como erupcin cutnea, picazn o urticarias, hinchazn de la cara, labios o lengua -problemas respiratorios -confusin -fiebre ms de 102 grados F -dolor, hormigueo, entumecimiento [employer] o pies -convulsiones -sangrado, magulladuras  inusuales -cansancio o debilidad inusual  Efectos secundarios que, por lo general, no requieren atencin mdica (debe informarlos a su mdico o a Producer, television/film/video de la salud si persisten o si son molestos): -molestias o dolores -diarrea -fiebre de 102 grados F o menos -dolor de cabeza -irritablidad -prdida del apetito -dolor, sensibildad en el lugar de la inyeccin -dificultad para conciliar el sueo Puede ser que esta lista no menciona todos los posibles efectos secundarios. Comunquese a su mdico por asesoramiento mdico [employer]. Usted puede informar los efectos secundarios a la FDA por telfono al 1-800-FDA-1088. Dnde debo guardar mi medicina? No se aplica en este caso. Esta vacuna se administrar en una clnica, la oficina de su mdico o en el consultorio de un profesional de la salud y no necesitar guardarlo en su domicilio. ATENCIN: Este folleto es un resumen. Puede ser que no cubra toda la posible informacin. Si usted tiene preguntas acerca de esta medicina, consulte con su mdico, su farmacutico o su profesional de Radiographer, therapeutic.  2019 Elsevier/Gold Standard (2014-12-14 00:00:00)   Madilyn Fireman Tdap (contra el ttanos, la difteria y la tos [place]): lo que debe saber Tdap Vaccine (Tetanus, Diphtheria and Pertussis): What You Need to Know 1. Por qu vacunarse? El ttanos, la difteria y la tos Benetta Spar son enfermedades muy graves. La vacuna Tdap nos puede proteger de estas enfermedades. Adems, la vacuna Tdap que se aplica a las [address] proteger a los bebs recin nacidos contra la tos [place]. En la actualidad, el [place] (trismo) es una enfermedad poco frecuente en los [place]. Provoca la contraccin y el endurecimiento doloroso de los msculos, por lo general, de todo el cuerpo.  Puede causar el endurecimiento de los msculos de la cabeza y el cuello, de modo que impide abrir la boca, tragar y, en algunos casos, incluso respirar. El ttanos causa la  muerte de aproximadamente 1de cada 10personas que contraen la infeccin, incluso despus de que reciben la mejor atencin mdica. La DIFTERIA tambin es poco frecuente en los [place] [employer]. Puede causar la formacin de una membrana gruesa en la parte posterior de la garganta.  Esto tiene como consecuencia problemas respiratorios, insuficiencia cardaca, parlisis y la [place]. La TOS FERINA (tos convulsiva) provoca episodios de tos intensa que pueden dificultar la respiracin y provocar vmitos y trastornos del sueo.  Tambin puede causar prdida de peso, incontinencia y fractura de [place]. Dos de cada 100 adolescentes y 5 de cada 100 adultos con tos ferina deben ser hospitalizados o tienen complicaciones, que pueden incluir neumona y Musician. Estas enfermedades son provocadas por bacterias. La difteria y la tos Benetta Spar se contagian de Neomia Dear persona a otra a travs de las secreciones de la tos o el estornudo. El ttanos ingresa al organismo a travs de cortes, rasguos o heridas. Antes de las vacunas, en los [place] se informaban 200000 casos de difteria, 200000 casos de tos [place] y cientos de casos de ttanos cada ao. Desde el inicio de la vacunacin, los informes de casos de ttanos y difteria han disminuido alrededor del 99% y, los de tos [place], alrededor del 80%. 2. Madilyn Fireman Tdap La vacuna Tdap puede proteger a adolescentes y adultos contra el ttanos, la difteria y la tos [place]. Se administra una dosis de Tdap a los 11 o 12 aos. Las [employer] no recibieron la vacuna Tdap a esa edad deben recibirla tan pronto como sea posible. Es muy importante que los mdicos y todos aquellos que tengan contacto cercano con bebs menores de  reciban la vacuna Tdap. Las mujeres deben recibir una dosis de la vacuna Tdap en cada embarazo para proteger al recin nacido de la tos [place]. Los bebs tienen mayor riesgo de sufrir complicaciones graves y potencialmente mortales  debido a la tos [place]. Otra vacuna llamada Td protege contra el ttanos y la difteria, pero no contra la tos [place]. Todos deberan recibir una dosis de refuerzo de Td cada 10 aos. La vacuna Tdap puede aplicarse como uno de estos refuerzos si nunca antes recibi esta vacuna. La vacuna Tdap tambin se puede aplicar despus de un corte o quemadura grave para prevenir la infeccin por ttanos. El mdico o la persona que le aplique la vacuna puede darle ms informacin al [employer]. La vacuna Tdap puede administrarse de manera segura simultneamente con otras vacunas. 3. Algunas personas no deben recibir esta vacuna  Una persona que alguna vez tuvo una reaccin alrgica potencialmente mortal a Neomia Dear dosis previa de cualquier vacuna contra el ttanos, la difteria o la tos [place], O que tenga una alergia grave a cualquiera de los componentes de esta vacuna, no debe recibir la vacuna Tdap. Informe a la persona que le aplica la vacuna si tiene cualquier alergia grave.  Una persona que estuvo en estado de coma o sufri mltiples convulsiones en el trmino de los [address] despus de recibir una dosis de DTP o DTaP durante su infancia, o despus de una dosis previa de Tdap, no debe recibir la vacuna Tdap, a menos que se haya encontrado otra causa que no fuera la vacuna. An puede recibir la vacuna Td.  Consulte con su mdico si: ? tiene convulsiones u otro problema del sistema nervioso, ? tuvo hinchazn o dolor intenso despus de cualquier vacuna contra la difteria, el ttanos o la tos [place], ? alguna vez ha sufrido el sndrome de Pension scheme manager (SGB), ? no se siente Research scientist (life sciences) en que se ha programado la vacuna. 4. Riesgos Con cualquier medicamento, incluso las vacunas, existe la posibilidad de que aparezcan efectos secundarios. Suelen ser leves y desaparecen por s solos. Si bien es posible tener [place] graves, estas son Lynnae Sandhoff raras. La [employer] de las personas a las que se les aplica la vacuna Tdap no tienen  ningn problema. Problemas leves despus de la vacuna Tdap (No interfirieron en las actividades)  Art therapist donde se aplic la vacuna (alrededor de 3 de cada 4 adolescentes o 2 de cada 3 adultos)  Enrojecimiento o Paramedic donde se aplic la vacuna (alrededor de 1 de cada 5 personas)  Fiebre leve de al menos 100,52F (38C) (alrededor de 1 cada 25 adolescentes o 1 de cada 100 adultos)  Dolor de Training and development officer (alrededor de 3 o 4 de cada 10 personas)  Cansancio (alrededor de 1 de cada 3 o 4 personas)  Nuseas, vmitos, diarrea, dolor de estmago (1 de cada 4 adolescentes o 1 de cada 10 adultos)  Escalofros, dolores articulares (alrededor de 1de cada 10personas)  Dolores corporales (alrededor de 1de cada 3 o 4personas)  Erupcin cutnea, inflamacin de los ganglios (poco frecuente) Problemas moderados despus de recibir la vacuna Tdap (Interfirieron en las [place], pero no exigieron atencin mdica)  Art therapist donde se aplic la vacuna (1de cada 5 o 6)  Enrojecimiento o inflamacin en el lugar donde se aplic la vacuna (alrededor de 1 de cada 16adolescentes o 1 de cada 12adultos)  Fiebre de ms de 102F (38,8C) (alrededor de 1 de cada 100 adolescentes o 1  de cada 250 adultos)  Dolor de Training and development officer (alrededor de 1de cada 7adolescentes o 1de cada 10adultos)  Nuseas, vmitos, diarrea, dolor de estmago (hasta 1 o 3 de cada 100 personas)  Hinchazn de todo Journalist, newspaper que se aplic la vacuna (alrededor de 1de cada 500personas) Problemas graves despus de la vacuna Tdap (Impidieron Education officer, environmental las actividades habituales y exigieron atencin mdica)  Inflamacin, dolor intenso, sangrado y enrojecimiento en el brazo en que se aplic la vacuna (poco frecuente). Problemas que podran ocurrir despus de cualquier vacuna:  A veces, las personas se desmayan despus de un procedimiento mdico, incluida la vacunacin. Permanecer sentado o recostado  durante puede ayudar a [employer] y las lesiones causadas por las cadas. Informe al mdico si se siente mareado, tiene cambios en la visin o zumbidos en los odos.  Algunas personas sienten un dolor intenso en el hombro y tienen dificultad para mover el brazo donde se aplic la vacuna. Esto sucede con muy poca frecuencia.  Cualquier medicamento puede causar una reaccin alrgica grave. Dichas reacciones son Lynnae Sandhoff poco frecuentes con una vacuna (se calcula que menos de 1en un milln de dosis) y se producen entre unos minutos y unas horas despus de la vacunacin. Al igual que con cualquier [employer], existe una probabilidad muy remota de que una vacuna cause una lesin grave o la [place]. La seguridad de las vacunas se controla permanentemente. Para obtener ms informacin, visite: http://floyd.org/ 5. Qu pasa si se presenta un problema grave? A qu signos debo estar atento?  Est atento a la aparicin de signos preocupantes, como los de una reaccin alrgica grave, fiebre muy alta o comportamiento fuera de lo normal. Los signos de una reaccin alrgica grave pueden incluir ronchas, hinchazn de la cara y la garganta, dificultad para respirar, latidos cardacos acelerados, mareos y debilidad. Generalmente, estos comienzan entre unos pocos minutos y algunas horas despus de la vacunacin. Qu debo hacer?  Si usted piensa que se trata de una reaccin alrgica grave o de otra emergencia que no puede esperar, llame al 9-1-1 o dirjase al hospital ms cercano. De lo contrario, llame al mdico.  Despus, la reaccin debe informarse al Sistema de Informe de Eventos Adversos de [place] (Vaccine Adverse Event Reporting System, VAERS). El mdico puede presentar este informe, o bien puede hacerlo usted mismo a travs del sitio web de VAERS, en www.vaers.LAgents.no, o llamando al ([phone]. El [place] de Informe de Eventos Adversos de [place] no brinda recomendaciones  mdicas. 6. [employer] de Compensacin de Daos por [employer] El [place] de Compensacin de Daos por Administrator, arts (National Vaccine Injury Compensation Program, VICP) es un programa federal que fue creado para Patent examiner a las personas que puedan haber sufrido daos al recibir ciertas vacunas. Aquellas personas que consideren que han sufrido un dao como consecuencia de una vacuna y [place] saber ms acerca del programa y de cmo presentar un reclamo, pueden llamar al [phone] o visitar el sitio web del VICP en [email or web address]. Hay un lmite de tiempo para presentar un reclamo de compensacin. 7. Cmo puedo obtener ms informacin?  Consulte a su mdico. Este puede darle el prospecto de la vacuna o recomendarle otras fuentes de informacin.  Comunquese con el servicio de salud de su localidad o [address]  Comunquese con Building control surveyor for [employer] and Prevention, CDC (Centros para el Control y la Prevencin de [place]): ? Llame al [phone] (1-800-CDC-INFO) o ? Visite el sitio Environmental manager en PicCapture.uy Declaracin de informacin  sobre la vacuna Tdap (24/12/2013) Esta informacin no tiene Theme park manager el consejo del mdico. Asegrese de hacerle al mdico cualquier pregunta que tenga. Document Released: 10/07/2012 Document Revised: 06/24/2018 Document Reviewed: 06/24/2018 Elsevier Interactive Patient Education  2019 [employer].  Informacin bsica sobre la diabetes Diabetes Basics  La diabetes (diabetes mellitus) es una enfermedad de larga duracin (crnica). Se produce cuando el cuerpo no utiliza Artist (glucosa) que se libera de los alimentos despus de comer. La diabetes puede deberse a uno de [employer] Brands o a ambos:  El pncreas no produce suficiente cantidad de una hormona llamada insulina.  El cuerpo no reacciona de forma normal a la insulina que produce. La insulina permite que ciertos  azcares (glucosa) ingresen a las clulas del cuerpo. Esto le proporciona energa. Si tiene diabetes, los azcares no pueden ingresar a las clulas. Esto produce un aumento del nivel de Banker (hiperglucemia). Siga estas indicaciones en su casa: Cmo se trata la diabetes? Es posible que tenga que administrarse insulina u otros medicamentos para la diabetes todos los [place] para mantener el nivel de International aid/development worker en la sangre equilibrado. Adminstrese los medicamentos para la diabetes todos los [place] se lo haya indicado el mdico. Haga una lista de los medicamentos para la diabetes aqu: Medicamentos para la diabetes  Nombre del medicamento: ______________________________ ? Cantidad (dosis): ________________ Mammie Russian (a.m./p.m.): _______________ Cleon Dew: ___________________________________  Josephine Igo medicamento: ______________________________ ? Cantidad (dosis): ________________ Mammie Russian (a.m./p.m.): _______________ Cleon Dew: ___________________________________  Josephine Igo medicamento: ______________________________ ? Cantidad (dosis): ________________ Mammie Russian (a.m./p.m.): _______________ Cleon Dew: ___________________________________ Si [place] insulina, aprender cmo aplicrsela con inyecciones. Es posible que deba ajustar la cantidad en funcin de los alimentos que coma. Haga una lista de los tipos de [place] aqu: Insulina  Tipo de insulina: ______________________________ ? Cantidad (dosis): ________________ Mammie Russian (a.m./p.m.): _______________ Cleon Dew: ___________________________________  Levander Campion: ______________________________ ? Cantidad (dosis): ________________ Mammie Russian (a.m./p.m.): _______________ Cleon Dew: ___________________________________  Levander Campion: ______________________________ ? Cantidad (dosis): ________________ Mammie Russian (a.m./p.m.): _______________ Cleon Dew: ___________________________________  Levander Campion: ______________________________ ? Cantidad (dosis):  ________________ Mammie Russian (a.m./p.m.): _______________ Cleon Dew: ___________________________________  Levander Campion: ______________________________ ? Cantidad (dosis): ________________ Mammie Russian (a.m./p.m.): _______________ Cleon Dew: ___________________________________ Cmo me controlo el nivel de azcar en la sangre?  Controle sus niveles de azcar en la sangre con un medidor de glucemia segn las indicaciones del mdico. El mdico fijar los objetivos del tratamiento para usted. Generalmente, los resultados de los niveles de azcar en la sangre deben ser los siguientes:  Antes de las comidas (preprandial): de 80 a 130mg /dl (de 4,4 a 2,9FAOZ/H).  Despus de las comidas (posprandial): por debajo de 180mg /dl (08MVHQ/I).  Nivel de A1c: menos del 7%. Anote las veces que se controlar los niveles de azcar en la sangre: Controles de azcar en la sangre  Hora: _______________ Cleon Dew: ___________________________________  Mammie Russian: _______________ Notas: ___________________________________  Hora: _______________ Notas: ___________________________________  Hora: _______________ Notas: ___________________________________  Hora: _______________ Notas: ___________________________________  Hora: _______________ Notas: ___________________________________  Ladell Heads debo saber acerca del nivel bajo de azcar en la sangre? Un nivel bajo de azcar en la sangre se denomina hipoglucemia. Este cuadro ocurre cuando el nivel de International aid/development worker en la sangre es igual o menor que 70mg /dl (6,9GEXB/M). Entre los sntomas, se pueden incluir los siguientes:  Sentir: ? [place]. ? Preocupacin o nervios (ansiedad). ? Sudoracin y [employer] ? Confusin. ? Mareos. ? Somnolencia. ? Ganas de vomitar (nuseas).  Tener: ? Latidos cardacos acelerados. ? Dolor de [place]. ? Cambios en la visin. ? Hormigueo  y falta de sensibilidad (entumecimiento) alrededor de la boca, los labios o la [place]. ? Movimientos espasmdicos que  no puede controlar (convulsiones).  Dificultades para hacer lo siguiente: ? Moverse (coordinacin). ? Dormir. ? Desmayos. ? Molestarse con facilidad (irritabilidad). Tratamiento del nivel bajo de azcar en la sangre Para tratar un nivel bajo de azcar en la sangre, ingiera un alimento o una bebida azucarada de inmediato. Si puede pensar con claridad y tragar de manera segura, siga la regla 15/15, que consiste en lo siguiente:  Consumir 15gramos de un hidrato de carbono de accin rpida (carbohidrato). Hable con su mdico acerca de cunto debera consumir.  Algunos hidratos de carbono de accin rpida son: ? Comprimidos de azcar (pastillas de glucosa). Consuma 3o 4pastillas de glucosa. ? De 6 a 8unidades de caramelos duros. ? De 4 a 6onzas (de 120 a ) de jugo de frutas. ? De 4 a 6onzas (de 120 a ) de refresco comn (no diettico). ? 1 cucharada (15ml) de miel o azcar.  Contrlese el nivel de azcar en la sangre despus de ingerir el hidrato de carbono.  Si el nivel de azcar en la sangre todava es igual o menor que /dl (1,6XWRU/E), ingiera nuevamente 15gramos de un hidrato de carbono.  Si el nivel de azcar en la sangre no supera los /dl (4,5WUJW/J) despus de 3intentos, solicite ayuda de inmediato.  Ingiera una comida o una colacin en el transcurso de 1hora despus de que el nivel de azcar en la sangre se haya normalizado. Tratamiento del nivel muy bajo de azcar en la sangre Si el nivel de azcar en la sangre es igual o menor que /dl (54mmol/l), significa que est muy bajo (hipoglucemia grave). Esto es Radio broadcast assistant. No espere a ver si los sntomas desaparecen. Solicite atencin mdica de inmediato. Comunquese con el servicio de emergencias de su localidad (911 en los [place]). No conduzca por sus propios medios [facility]. Preguntas para hacerle al mdico  Es necesario que me rena con IT trainer en el cuidado  de la diabetes?  Qu equipos necesitar para cuidarme en casa?  Qu medicamentos para la diabetes necesito? Cundo debo tomarlos?  Con qu frecuencia debo controlar mi nivel de azcar en la sangre?  A qu nmero puedo llamar si tengo preguntas?  Cundo es mi prxima cita con el mdico?  Dnde puedo encontrar un grupo de apoyo para las personas con diabetes? Dnde buscar ms informacin  American Diabetes Association (Asociacin Estadounidense de la Diabetes): www.diabetes.org  American Association of Diabetes Educators (Asociacin Estadounidense de Instructores para el Cuidado de la Diabetes): www.diabeteseducator.org/patient-resources Comunquese con un mdico si:  El nivel de azcar en la sangre es igual o mayor que /dl (19,1YNWG/NF) durante 2das seguidos.  Ha estado enfermo o ha tenido fiebre durante 2das o ms y no mejora.  Tiene alguno de estos problemas durante ms de 6horas: ? No puede comer ni beber. ? Siente malestar estomacal (nuseas). ? [place]om[place]. ? Presenta heces lquidas (diarrea). Solicite ayuda inmediatamente si:  El nivel de azcar en la sangre est por debajo de /dl (26mmol/l).  Se siente confundido.  Tiene dificultad para hacer lo siguiente: ? Pensar con claridad. ? Respirar. Resumen  La diabetes (diabetes mellitus) es una enfermedad de larga duracin (crnica). Se produce cuando el cuerpo no utiliza Artist (glucosa) que se libera de los alimentos despus de la digestin.  Aplquese la insulina y tome los medicamentos para la diabetes como se lo hayan indicado.  Contrlese  el nivel de azcar en la sangre todos los [place], con la frecuencia que le hayan indicado.  Concurra a todas las visitas de control como se lo haya indicado el mdico. Esto es importante. Esta informacin no tiene Theme park managercomo fin reemplazar el consejo del mdico. Asegrese de hacerle al mdico cualquier pregunta que tenga. Document Released: 02/27/2018  Document Revised: 06/02/2018 Document Reviewed: 02/27/2018 Elsevier Interactive Patient Education  2019 [employer]

## 2019-03-25 LAB — LIPID PANEL
Chol/HDL Ratio: 4.9 ratio (ref 0.0–5.0)
Cholesterol, Total: 137 mg/dL (ref 100–199)
HDL: 28 mg/dL — ABNORMAL LOW (ref >39)
Triglycerides: 429 mg/dL — ABNORMAL HIGH (ref 0–149)

## 2019-03-25 LAB — HEMOGLOBIN A1C
Est. average glucose Bld gHb Est-mCnc: 192 mg/dL
Hgb A1c MFr Bld: 8.3 % — ABNORMAL HIGH (ref 4.8–5.6)

## 2019-03-28 ENCOUNTER — Other Ambulatory Visit: Payer: Self-pay | Admitting: Family Medicine

## 2019-03-28 NOTE — Telephone Encounter (Signed)
Recent labs indicate dyslipidemia and hypertriglyceridemia-statin therapy is indicated.  Start atorvastatin 40 mg daily with dinner at 6 pm A1C has increased to 8.3. Goal is <7.0 Resume Lantus 20 units daily. Do not omit dose unless blood sugar is less than 125. I am also placing a referral for him to attend diabetes education to facilitate better management of diabetes.

## 2019-03-30 ENCOUNTER — Encounter: Payer: Self-pay | Admitting: Family Medicine

## 2019-03-30 MED ORDER — ATORVASTATIN CALCIUM 40 MG PO TABS: 40.0000 mg | ORAL_TABLET | Freq: Every day | ORAL | 3 refills | Status: DC

## 2019-03-30 NOTE — Addendum Note (Signed)
Addended by: Bing Neighbors on: 03/30/2019 01:29 PM   Modules accepted: Orders

## 2019-04-29 ENCOUNTER — Ambulatory Visit: Payer: BLUE CROSS/BLUE SHIELD | Admitting: *Deleted

## 2019-08-31 ENCOUNTER — Encounter: Payer: Self-pay | Admitting: Family Medicine

## 2019-08-31 MED ORDER — INSULIN GLARGINE 100 UNIT/ML SOLOSTAR PEN: 20.0000 [IU] | PEN_INJECTOR | Freq: Every day | SUBCUTANEOUS | 0 refills | Status: DC

## 2020-01-16 ENCOUNTER — Encounter: Payer: Self-pay | Admitting: Family Medicine

## 2020-02-01 ENCOUNTER — Telehealth: Payer: Self-pay

## 2020-02-01 NOTE — Telephone Encounter (Signed)
Called patient to do their pre-visit COVID screening.  Call went to voicemail. Unable to do prescreening.  

## 2020-02-01 NOTE — Patient Instructions (Signed)
Thank you for choosing Primary Care at [facility] to be your medical home!    Rodney Fisher was seen by De Hollingshead, DO today.   Rodney Fisher's primary care provider is Marcy Siren, DO.   For the best care possible, you should try to see Marcy Siren, DO whenever you come to the clinic.   We look forward to seeing you again soon!  If you have any questions about your visit today, please call us at [phone] or feel free to reach your primary care provider via MyChart.

## 2020-02-02 ENCOUNTER — Other Ambulatory Visit: Payer: Self-pay

## 2020-02-02 ENCOUNTER — Encounter: Payer: Self-pay | Admitting: Internal Medicine

## 2020-02-02 ENCOUNTER — Ambulatory Visit (INDEPENDENT_AMBULATORY_CARE_PROVIDER_SITE_OTHER): Payer: BLUE CROSS/BLUE SHIELD | Admitting: Internal Medicine

## 2020-02-02 VITALS — BP 132/87 | HR 98 | Temp 97.3°F | Resp 17 | Wt 176.0 lb

## 2020-02-02 DIAGNOSIS — E119 Type 2 diabetes mellitus without complications: Secondary | ICD-10-CM | POA: Diagnosis not present

## 2020-02-02 DIAGNOSIS — Z13228 Encounter for screening for other metabolic disorders: Secondary | ICD-10-CM

## 2020-02-02 DIAGNOSIS — E781 Pure hyperglyceridemia: Secondary | ICD-10-CM

## 2020-02-02 DIAGNOSIS — Z114 Encounter for screening for human immunodeficiency virus [HIV]: Secondary | ICD-10-CM

## 2020-02-02 DIAGNOSIS — Z794 Long term (current) use of insulin: Secondary | ICD-10-CM | POA: Diagnosis not present

## 2020-02-02 LAB — POCT GLYCOSYLATED HEMOGLOBIN (HGB A1C): Hemoglobin A1C: 12.2 % — AB (ref 4.0–5.6)

## 2020-02-02 MED ORDER — BD PEN NEEDLE MINI U/F 31G X 5 MM MISC: 5 refills | Status: AC

## 2020-02-02 MED ORDER — ATORVASTATIN CALCIUM 40 MG PO TABS: 40.0000 mg | ORAL_TABLET | Freq: Every day | ORAL | 1 refills | Status: DC

## 2020-02-02 MED ORDER — INSULIN GLARGINE 100 UNIT/ML SOLOSTAR PEN: 20.0000 [IU] | PEN_INJECTOR | Freq: Every day | SUBCUTANEOUS | 1 refills | Status: AC

## 2020-02-02 MED ORDER — METFORMIN HCL 1000 MG PO TABS: 1000.0000 mg | ORAL_TABLET | Freq: Two times a day (BID) | ORAL | 0 refills | Status: AC

## 2020-02-02 NOTE — Progress Notes (Signed)
Subjective:    Rodney Fisher - 39 y.o. male MRN [id number]  Date of birth: 12-25-1980  HPI  Rodney Fisher is here for chronic medical condition f/u.  Diabetes mellitus, Type 2 Disease Monitoring             Blood Sugar Ranges: Fasting - Does not monitor.              Polyuria: No              Visual problems: no   Urine Microalbumin 25 in Jan 2020   Last A1C: 8.3 (May 2020)   Medications: Metformin 500 mg BID, Lantus 20u daily  Medication Compliance: Will oral medications. Reports takes insulin 3x per week on average.   Medication Side Effects             Hypoglycemia: no     Health Maintenance:  Health Maintenance Due  Topic Date Due  . OPHTHALMOLOGY EXAM  Never done  . HIV Screening  Never done  . HEMOGLOBIN A1C  09/24/2019  . URINE MICROALBUMIN  11/12/2019    -  reports that he has been smoking. He has been smoking about 0.50 packs per day. He has never used smokeless tobacco. - Review of Systems: Per HPI. - Past Medical History: Patient Active Problem List   Diagnosis Date Noted  . Type 2 diabetes mellitus without complication, without long-term current use of insulin (HCC) 11/11/2018  . Acute pancreatitis 07/26/2018  . Low back pain with left-sided sciatica 06/22/2014   - Medications: reviewed and updated   Objective:   Physical Exam BP 132/87   Pulse 98   Temp (!) 97.3 F (36.3 C) (Temporal)   Resp 17   Wt 176 lb (79.8 kg)   SpO2 98%   BMI 29.29 kg/m  Physical Exam  Constitutional: He is oriented to person, place, and time and well-developed, well-nourished, and in no distress. No distress.  Cardiovascular: Normal rate.  Pulmonary/Chest: Effort normal. No respiratory distress.  Musculoskeletal:        General: Normal range of motion.  Neurological: He is alert and oriented to person, place, and time.  Skin: Skin is warm and dry. He is not diaphoretic.  Psychiatric: Affect and judgment normal.           Assessment & Plan:   1. Type 2  diabetes mellitus without complication, with long-term current use of insulin (HCC) A1c markedly elevated at 12.2%. Likely worsened by lack of medication compliance and patient does admit to not being as strict with diet. Counseled to resume daily insulin regimen and to monitor fasting CBG daily. Reviewed hypoglycemia protocol. Will increase Metformin dose to 1000 mg BID. Counseled on potential GI side effects with dose increase. Follow up in one month with CBG log.  Counseled on Diabetic diet, my plate method, 818 minutes of moderate intensity exercise/week Blood sugar logs with fasting goals of 80-120 mg/dl, random of less than 299 and in the event of sugars less than 60 mg/dl or greater than 371 mg/dl encouraged to notify the clinic. Advised on the need for annual eye exams, annual foot exams, Pneumonia vaccine. - HgB A1c - Microalbumin/Creatinine Ratio, Urine - Comprehensive metabolic panel - insulin glargine (LANTUS) 100 UNIT/ML Solostar Pen; Inject 20 Units into the skin daily.  Dispense: 15 mL; Refill: 1 - metFORMIN (GLUCOPHAGE) 1000 MG tablet; Take 1 tablet (1,000 mg total) by mouth 2 (two) times daily with a meal.  Dispense: 180 tablet; Refill: 0 -  B-D UF III MINI PEN NEEDLES 31G X 5 MM MISC; USE UTD WITH INSULIN PENS  Dispense: 100 each; Refill: 5 - Ambulatory referral to Ophthalmology  2. Hypertriglyceridemia - Lipid Panel - atorvastatin (LIPITOR) 40 MG tablet; Take 1 tablet (40 mg total) by mouth daily.  Dispense: 90 tablet; Refill: 1  3. Screening for HIV (human immunodeficiency virus) - HIV antibody (with reflex)  4. Screening for metabolic disorder - CBC with Differential      Phill Myron, D.O. 02/02/2020, 4:13 PM Primary Care at [facility]

## 2020-02-03 ENCOUNTER — Other Ambulatory Visit: Payer: Self-pay | Admitting: Internal Medicine

## 2020-02-03 DIAGNOSIS — E781 Pure hyperglyceridemia: Secondary | ICD-10-CM

## 2020-02-03 LAB — LIPID PANEL
Chol/HDL Ratio: 6.8 ratio — ABNORMAL HIGH (ref 0.0–5.0)
Cholesterol, Total: 178 mg/dL (ref 100–199)
HDL: 26 mg/dL — ABNORMAL LOW (ref >39)
Triglycerides: 977 mg/dL (ref 0–149)

## 2020-02-03 LAB — CBC WITH DIFFERENTIAL/PLATELET
Basophils Absolute: 0 10*3/uL (ref 0.0–0.2)
Basos: 1 %
EOS (ABSOLUTE): 0.1 10*3/uL (ref 0.0–0.4)
Eos: 1 %
Hematocrit: 51.9 % — ABNORMAL HIGH (ref 37.5–51.0)
Hemoglobin: 18.5 g/dL — ABNORMAL HIGH (ref 13.0–17.7)
Immature Grans (Abs): 0 10*3/uL (ref 0.0–0.1)
Immature Granulocytes: 0 %
Lymphocytes Absolute: 2.1 10*3/uL (ref 0.7–3.1)
Lymphs: 32 %
MCH: 30.6 pg (ref 26.6–33.0)
MCHC: 35.6 g/dL (ref 31.5–35.7)
MCV: 86 fL (ref 79–97)
Monocytes Absolute: 0.5 10*3/uL (ref 0.1–0.9)
Monocytes: 8 %
Neutrophils Absolute: 3.8 10*3/uL (ref 1.4–7.0)
Neutrophils: 58 %
Platelets: 165 10*3/uL (ref 150–450)
RBC: 6.04 x10E6/uL — ABNORMAL HIGH (ref 4.14–5.80)
RDW: 11.8 % (ref 11.6–15.4)
WBC: 6.5 10*3/uL (ref 3.4–10.8)

## 2020-02-03 LAB — COMPREHENSIVE METABOLIC PANEL
ALT: 32 IU/L (ref 0–44)
AST: 27 IU/L (ref 0–40)
Albumin/Globulin Ratio: 2.3 — ABNORMAL HIGH (ref 1.2–2.2)
Albumin: 4.6 g/dL (ref 4.0–5.0)
Alkaline Phosphatase: 107 IU/L (ref 39–117)
BUN/Creatinine Ratio: 14 (ref 9–20)
BUN: 13 mg/dL (ref 6–20)
Bilirubin Total: 0.5 mg/dL (ref 0.0–1.2)
CO2: 21 mmol/L (ref 20–29)
Calcium: 9.2 mg/dL (ref 8.7–10.2)
Chloride: 97 mmol/L (ref 96–106)
Creatinine, Ser: 0.94 mg/dL (ref 0.76–1.27)
GFR calc Af Amer: 118 mL/min/{1.73_m2} (ref >59)
GFR calc non Af Amer: 102 mL/min/{1.73_m2} (ref >59)
Globulin, Total: 2 g/dL (ref 1.5–4.5)
Glucose: 342 mg/dL — ABNORMAL HIGH (ref 65–99)
Potassium: 4.1 mmol/L (ref 3.5–5.2)
Sodium: 136 mmol/L (ref 134–144)
Total Protein: 6.6 g/dL (ref 6.0–8.5)

## 2020-02-03 LAB — HIV ANTIBODY (ROUTINE TESTING W REFLEX): HIV Screen 4th Generation wRfx: NONREACTIVE

## 2020-02-03 LAB — MICROALBUMIN / CREATININE URINE RATIO
Creatinine, Urine: 52.4 mg/dL
Microalb/Creat Ratio: 16 mg/g creat (ref 0–29)
Microalbumin, Urine: 8.2 ug/mL

## 2020-02-03 MED ORDER — FENOFIBRATE MICRONIZED 130 MG PO CAPS: 130.0000 mg | ORAL_CAPSULE | Freq: Every day | ORAL | 1 refills | Status: DC

## 2020-02-03 MED ORDER — ATORVASTATIN CALCIUM 80 MG PO TABS: 80.0000 mg | ORAL_TABLET | Freq: Every day | ORAL | 3 refills | Status: AC

## 2020-02-29 ENCOUNTER — Emergency Department (HOSPITAL_BASED_OUTPATIENT_CLINIC_OR_DEPARTMENT_OTHER)
Admission: EM | Admit: 2020-02-29 | Discharge: 2020-02-29 | Disposition: A | Discharge status: Home / Self Care | Payer: BLUE CROSS/BLUE SHIELD | Attending: Emergency Medicine | Admitting: Emergency Medicine

## 2020-02-29 ENCOUNTER — Encounter (HOSPITAL_BASED_OUTPATIENT_CLINIC_OR_DEPARTMENT_OTHER): Payer: Self-pay

## 2020-02-29 ENCOUNTER — Other Ambulatory Visit: Payer: Self-pay

## 2020-02-29 DIAGNOSIS — Z794 Long term (current) use of insulin: Secondary | ICD-10-CM | POA: Insufficient documentation

## 2020-02-29 DIAGNOSIS — Z79899 Other long term (current) drug therapy: Secondary | ICD-10-CM | POA: Diagnosis not present

## 2020-02-29 DIAGNOSIS — Y9389 Activity, other specified: Secondary | ICD-10-CM | POA: Diagnosis not present

## 2020-02-29 DIAGNOSIS — Y999 Unspecified external cause status: Secondary | ICD-10-CM | POA: Diagnosis not present

## 2020-02-29 DIAGNOSIS — F17210 Nicotine dependence, cigarettes, uncomplicated: Secondary | ICD-10-CM | POA: Diagnosis not present

## 2020-02-29 DIAGNOSIS — S29012A Strain of muscle and tendon of back wall of thorax, initial encounter: Secondary | ICD-10-CM | POA: Diagnosis not present

## 2020-02-29 DIAGNOSIS — Y92410 Unspecified street and highway as the place of occurrence of the external cause: Secondary | ICD-10-CM | POA: Insufficient documentation

## 2020-02-29 DIAGNOSIS — E119 Type 2 diabetes mellitus without complications: Secondary | ICD-10-CM | POA: Insufficient documentation

## 2020-02-29 DIAGNOSIS — S299XXA Unspecified injury of thorax, initial encounter: Secondary | ICD-10-CM | POA: Diagnosis not present

## 2020-02-29 DIAGNOSIS — S29019A Strain of muscle and tendon of unspecified wall of thorax, initial encounter: Secondary | ICD-10-CM

## 2020-02-29 HISTORY — DX: Type 2 diabetes mellitus without complications: E11.9

## 2020-02-29 MED ORDER — IBUPROFEN 600 MG PO TABS: 600.0000 mg | ORAL_TABLET | Freq: Three times a day (TID) | ORAL | 0 refills | Status: AC | PRN
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
## 2020-03-08 ENCOUNTER — Telehealth: Payer: Self-pay

## 2020-03-08 NOTE — Telephone Encounter (Signed)
Called patient to do their pre-visit COVID screening.  Call went to voicemail. Unable to do prescreening.  

## 2020-03-09 ENCOUNTER — Ambulatory Visit (INDEPENDENT_AMBULATORY_CARE_PROVIDER_SITE_OTHER): Payer: BLUE CROSS/BLUE SHIELD | Admitting: Internal Medicine

## 2020-03-09 ENCOUNTER — Other Ambulatory Visit: Payer: Self-pay

## 2020-03-09 ENCOUNTER — Encounter: Payer: Self-pay | Admitting: Internal Medicine

## 2020-03-09 VITALS — BP 129/84 | HR 92 | Temp 97.3°F | Resp 17 | Wt 177.0 lb

## 2020-03-09 DIAGNOSIS — Z794 Long term (current) use of insulin: Secondary | ICD-10-CM

## 2020-03-09 DIAGNOSIS — E119 Type 2 diabetes mellitus without complications: Secondary | ICD-10-CM | POA: Diagnosis not present

## 2020-03-09 MED ORDER — DEXCOM G6 RECEIVER DEVI: 0 refills | Status: AC

## 2020-03-09 MED ORDER — DEXCOM G6 SENSOR MISC: 3 refills | Status: DC

## 2020-03-09 MED ORDER — DEXCOM G6 TRANSMITTER MISC: 0 refills | Status: DC

## 2020-03-09 NOTE — Progress Notes (Signed)
Subjective:    Rodney Fisher - 39 y.o. male MRN [id number]  Date of birth: [birth year]  HPI  Rodney Fisher is here for follow up of T2DM. At last visit on 3/31, found to have A1c increase from 8.8% in May 2020 to 12.2%. At that time patient admitted to poor compliance with Insulin, only taking his Lantus about 3x per week. Increased Metformin to 1000 mg BID and stressed importance of compliance with insulin. Asked him to return in a month with CBG log.   His fasting CBGs have been 180-280s. He did increase his Metformin dose. No diarrhea. Reports that he has been much better about taking his insulin daily and has not been missing doses. No episodes of hypoglycemia.       Health Maintenance:  Health Maintenance Due  Topic Date Due  . OPHTHALMOLOGY EXAM  Never done  . COVID-19 Vaccine (1) Never done    -  reports that he has been smoking cigarettes. He has been smoking about 0.50 packs per day. He has never used smokeless tobacco. - Review of Systems: Per HPI. - Past Medical History: Patient Active Problem List   Diagnosis Date Noted  . 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 Type 2 diabetes mellitus without complication, without long-term current use of insulin ([facility]) 11/11/2018  . Acute pancreatitis 07/26/2018  . Low back pain with left-sided sciatica 06/22/2014   - Medications: reviewed and updated   Objective:   Physical Exam BP 129/84   Pulse 92   Temp (!) 97.3 F (36.3 C) (Temporal)   Resp 17   Wt 177 lb (80.3 kg)   SpO2 98%   BMI 27.72 kg/m  Physical Exam  Constitutional: He is oriented to person, place, and time and well-developed, well-nourished, and in no distress. No distress.  Cardiovascular: Normal rate.  Pulmonary/Chest: Effort normal. No respiratory distress.  Musculoskeletal:        General: Normal range of motion.  Neurological: He is alert and oriented to person, place, and time.  Skin: Skin is  warm and dry. He is not diaphoretic.  Psychiatric: Affect and judgment normal.           Assessment & Plan:   1. Type 2 diabetes mellitus without complication, with long-term current use of insulin (HCC) Fasting CBGs remain elevated despite increase in Metformin dose and reported compliance with Lantus 20 u daily. Will have patient increase Lantus by 2u daily for fasting CBGs >130. No episodes of hypoglycemia so likely safe to titrate insulin in this manner. Advised patient to call for CBGs <70 or >250. Follow up in one month with CBG log. He requests a continuous glucose meter so these supplies were prescribed as well.  Counseled on Diabetic diet, my plate method, 790 minutes of moderate intensity exercise/week Blood sugar logs with fasting goals of 80-120 mg/dl, random of less than 180 and in the event of sugars less than 60 mg/dl or greater than 400 mg/dl encouraged to notify the clinic. Advised on the need for annual eye exams, annual foot exams, Pneumonia vaccine. - Continuous Blood Gluc Receiver (Reed) DEVI; Use to check blood sugar. Dx: E11.9, Z79.4  Dispense: 1 each; Refill: 0 - Continuous Blood Gluc Sensor (DEXCOM G6 SENSOR) MISC; Use to check blood sugar. Dx: E11.9, Z79.4  Dispense: 3 each; Refill: 3 - Continuous Blood Gluc Transmit (DEXCOM G6 TRANSMITTER) MISC; Use to check blood sugar. Dx: E11.9, Z79.4  Dispense: 1 each;  Refill: 0    Marcy Siren, D.O. 03/09/2020, 3:50 PM Primary Care at [facility]

## 2020-03-09 NOTE — Patient Instructions (Addendum)
For every morning that your fasting blood sugar is higher than 130, add 2 units of insulin. So if tomorrow it is above 130, take 22 units of insulin. If the next day it is still greater than 130, you will take 24 units. Once your blood sugar is 130 or less, stop at that dose and continue to take that.   Call me if your blood sugar is below 70 or above 250.   Por cada maana que su nivel de azcar en sangre en ayunas sea superior a 130, agregue 2 unidades de insulina. Entonces, si maana est por encima de 130, tome 22 unidades de insulina. Si al da siguiente sigue siendo superior a 130, tomar 24 unidades. Una vez que su nivel de azcar en sangre sea 130 o menos, detngase en esa dosis y contine tomndola.  Llmame si tu nivel de azcar en sangre est por debajo de 70 o por encima de 250.

## 2020-03-14 DIAGNOSIS — E119 Type 2 diabetes mellitus without complications: Secondary | ICD-10-CM | POA: Diagnosis not present

## 2020-04-06 ENCOUNTER — Telehealth: Payer: BLUE CROSS/BLUE SHIELD | Admitting: Internal Medicine

## 2020-04-14 ENCOUNTER — Other Ambulatory Visit: Payer: Self-pay

## 2020-04-14 DIAGNOSIS — Z794 Long term (current) use of insulin: Secondary | ICD-10-CM

## 2020-04-14 DIAGNOSIS — E119 Type 2 diabetes mellitus without complications: Secondary | ICD-10-CM

## 2020-04-14 MED ORDER — DEXCOM G6 TRANSMITTER MISC: 1 refills | Status: AC

## 2020-04-14 MED ORDER — DEXCOM G6 SENSOR MISC: 3 refills | Status: AC

## 2020-06-02 ENCOUNTER — Telehealth: Payer: Self-pay

## 2020-06-02 NOTE — Telephone Encounter (Signed)
Prior authorization for Fenofibrate Micronized 130 mg capsule was denied.  Insurance will cover Fenofibrate 54 mg & 160 mg or the Fenofibrate Micronized 67 mg, 134 mg or 200 mg.  Please advise.

## 2020-06-05 ENCOUNTER — Other Ambulatory Visit: Payer: Self-pay | Admitting: Internal Medicine

## 2020-06-05 DIAGNOSIS — E781 Pure hyperglyceridemia: Secondary | ICD-10-CM

## 2020-06-05 MED ORDER — FENOFIBRATE MICRONIZED 134 MG PO CAPS: 130.0000 mg | ORAL_CAPSULE | Freq: Every day | ORAL | 1 refills | Status: AC

## 2020-06-05 NOTE — Telephone Encounter (Signed)
I sent in the Fenofibrate Micronized 134 mg tablet.   Marcy Siren, D.O. Primary Care at [facility]  06/05/2020, 9:21 AM
